# Patient Record
Sex: Female | Born: 1975 | Race: Black or African American | Hispanic: No | Marital: Single | Smoking: Current every day smoker
Health system: Southern US, Community
[De-identification: ages and names within clinical notes are randomized; demographics above are authoritative.]

## PROBLEM LIST (undated history)

## (undated) HISTORY — PX: BACK SURGERY: SHX140

## (undated) HISTORY — PX: TUBAL LIGATION: SHX77

---

## 2003-05-29 ENCOUNTER — Ambulatory Visit (HOSPITAL_COMMUNITY): Admission: RE | Admit: 2003-05-29 | Discharge: 2003-05-30 | Payer: Self-pay | Admitting: Neurosurgery

## 2004-01-27 ENCOUNTER — Emergency Department: Payer: Self-pay | Admitting: Internal Medicine

## 2005-07-24 ENCOUNTER — Emergency Department: Payer: Self-pay | Admitting: Emergency Medicine

## 2005-10-08 ENCOUNTER — Emergency Department: Payer: Self-pay | Admitting: Emergency Medicine

## 2005-10-13 ENCOUNTER — Ambulatory Visit: Payer: Self-pay | Admitting: Emergency Medicine

## 2005-11-20 ENCOUNTER — Emergency Department: Payer: Self-pay | Admitting: Emergency Medicine

## 2005-11-25 ENCOUNTER — Emergency Department: Payer: Self-pay | Admitting: Emergency Medicine

## 2005-12-30 ENCOUNTER — Emergency Department: Payer: Self-pay | Admitting: Unknown Physician Specialty

## 2006-01-23 ENCOUNTER — Encounter: Payer: Self-pay | Admitting: Neurosurgery

## 2006-01-26 ENCOUNTER — Encounter: Payer: Self-pay | Admitting: Neurosurgery

## 2006-02-13 ENCOUNTER — Emergency Department: Payer: Self-pay | Admitting: Emergency Medicine

## 2006-02-22 ENCOUNTER — Emergency Department: Payer: Self-pay | Admitting: Emergency Medicine

## 2006-02-25 ENCOUNTER — Emergency Department: Payer: Self-pay | Admitting: Emergency Medicine

## 2006-12-28 ENCOUNTER — Encounter: Payer: Self-pay | Admitting: Maternal & Fetal Medicine

## 2007-06-01 ENCOUNTER — Observation Stay: Payer: Self-pay | Admitting: Unknown Physician Specialty

## 2007-06-01 ENCOUNTER — Inpatient Hospital Stay: Payer: Self-pay | Admitting: Unknown Physician Specialty

## 2007-06-07 ENCOUNTER — Observation Stay: Payer: Self-pay | Admitting: Obstetrics & Gynecology

## 2007-07-08 ENCOUNTER — Emergency Department: Payer: Self-pay | Admitting: Emergency Medicine

## 2007-07-18 ENCOUNTER — Emergency Department: Payer: Self-pay | Admitting: Emergency Medicine

## 2008-08-25 ENCOUNTER — Emergency Department: Payer: Self-pay | Admitting: Emergency Medicine

## 2008-09-09 ENCOUNTER — Emergency Department: Payer: Self-pay | Admitting: Unknown Physician Specialty

## 2010-01-22 ENCOUNTER — Emergency Department: Payer: Self-pay | Admitting: Emergency Medicine

## 2010-05-11 ENCOUNTER — Ambulatory Visit: Payer: Self-pay | Admitting: Obstetrics and Gynecology

## 2010-05-14 LAB — PATHOLOGY REPORT

## 2010-10-15 ENCOUNTER — Emergency Department: Payer: Self-pay | Admitting: Emergency Medicine

## 2011-02-15 ENCOUNTER — Emergency Department: Payer: Self-pay | Admitting: Unknown Physician Specialty

## 2011-05-23 ENCOUNTER — Encounter: Payer: Self-pay | Admitting: Obstetrics & Gynecology

## 2011-07-15 ENCOUNTER — Observation Stay: Payer: Self-pay

## 2011-07-15 LAB — URINALYSIS, COMPLETE
Bilirubin,UR: NEGATIVE
Blood: NEGATIVE
Glucose,UR: NEGATIVE mg/dL (ref 0–75)
Leukocyte Esterase: NEGATIVE
Ph: 6 (ref 4.5–8.0)
Specific Gravity: 1.028 (ref 1.003–1.030)
Transitional Epi: <1

## 2011-07-16 LAB — URINE CULTURE

## 2011-07-21 ENCOUNTER — Encounter: Payer: Self-pay | Admitting: Maternal and Fetal Medicine

## 2011-07-21 LAB — CBC WITH DIFFERENTIAL/PLATELET
Basophil #: 0 10*3/uL (ref 0.0–0.1)
Basophil %: 0.5 %
Eosinophil #: 0 10*3/uL (ref 0.0–0.7)
Eosinophil %: 0.6 %
HGB: 12 g/dL (ref 12.0–16.0)
MCH: 29.2 pg (ref 26.0–34.0)
MCHC: 33.4 g/dL (ref 32.0–36.0)
Monocyte %: 13.1 %
Neutrophil #: 6.1 10*3/uL (ref 1.4–6.5)
Platelet: 186 10*3/uL (ref 150–440)
RDW: 13.4 % (ref 11.5–14.5)

## 2011-07-21 LAB — COMPREHENSIVE METABOLIC PANEL
Albumin: 3.4 g/dL (ref 3.4–5.0)
Bilirubin,Total: 0.4 mg/dL (ref 0.2–1.0)
Chloride: 102 mmol/L (ref 98–107)
EGFR (African American): >60
EGFR (Non-African Amer.): >60
Osmolality: 267 (ref 275–301)
Potassium: 3.4 mmol/L — ABNORMAL LOW (ref 3.5–5.1)
Sodium: 135 mmol/L — ABNORMAL LOW (ref 136–145)

## 2011-07-25 ENCOUNTER — Encounter: Payer: Self-pay | Admitting: Maternal & Fetal Medicine

## 2011-07-25 LAB — CBC WITH DIFFERENTIAL/PLATELET
Basophil #: 0 10*3/uL (ref 0.0–0.1)
Eosinophil #: 0.1 10*3/uL (ref 0.0–0.7)
Eosinophil %: 1.2 %
HCT: 30.1 % — ABNORMAL LOW (ref 35.0–47.0)
HGB: 10.3 g/dL — ABNORMAL LOW (ref 12.0–16.0)
MCV: 87 fL (ref 80–100)
Neutrophil #: 3.7 10*3/uL (ref 1.4–6.5)
Platelet: 200 10*3/uL (ref 150–440)
RDW: 13.5 % (ref 11.5–14.5)

## 2011-07-25 LAB — BASIC METABOLIC PANEL
Anion Gap: 9 (ref 7–16)
BUN: 4 mg/dL — ABNORMAL LOW (ref 7–18)
Calcium, Total: 8.5 mg/dL (ref 8.5–10.1)
EGFR (African American): >60
Osmolality: 273 (ref 275–301)
Sodium: 139 mmol/L (ref 136–145)

## 2011-07-25 LAB — PROTEIN, URINE, 24 HOUR
Protein, 24 Hour Urine: 75 mg/24HR (ref 30–149)
Protein, Urine: 30 mg/dL (ref 0–12)

## 2011-07-25 LAB — HEPATIC FUNCTION PANEL A (ARMC)
Albumin: 2.7 g/dL — ABNORMAL LOW (ref 3.4–5.0)
SGOT(AST): 33 U/L (ref 15–37)
SGPT (ALT): 72 U/L

## 2011-07-25 LAB — CREATININE CLEARANCE, URINE, 24 HOUR
Collection Hours: 24 hours
Creatinine, Serum: 0.57 mg/dL (ref 0.50–1.20)
Creatinine, Urine: 344.4 mg/dL — ABNORMAL HIGH (ref 30.0–125.0)

## 2011-07-27 ENCOUNTER — Ambulatory Visit: Payer: Self-pay | Admitting: Oncology

## 2011-08-15 ENCOUNTER — Encounter: Payer: Self-pay | Admitting: Maternal and Fetal Medicine

## 2011-08-16 ENCOUNTER — Observation Stay: Payer: Self-pay | Admitting: Obstetrics and Gynecology

## 2011-08-16 LAB — PIH PROFILE
Anion Gap: 10 (ref 7–16)
BUN: 7 mg/dL (ref 7–18)
Chloride: 108 mmol/L — ABNORMAL HIGH (ref 98–107)
Creatinine: 0.8 mg/dL (ref 0.60–1.30)
MCH: 29.4 pg (ref 26.0–34.0)
MCHC: 34.3 g/dL (ref 32.0–36.0)
RDW: 13.2 % (ref 11.5–14.5)
WBC: 10 10*3/uL (ref 3.6–11.0)

## 2011-08-16 LAB — COMPREHENSIVE METABOLIC PANEL
Alkaline Phosphatase: 163 U/L — ABNORMAL HIGH (ref 50–136)
Anion Gap: 13 (ref 7–16)
Bilirubin,Total: 0.5 mg/dL (ref 0.2–1.0)
Calcium, Total: 8.4 mg/dL — ABNORMAL LOW (ref 8.5–10.1)
Chloride: 105 mmol/L (ref 98–107)
EGFR (Non-African Amer.): >60
Osmolality: 280 (ref 275–301)
SGOT(AST): 17 U/L (ref 15–37)
Total Protein: 6.7 g/dL (ref 6.4–8.2)

## 2011-08-16 LAB — URINALYSIS, COMPLETE
Bacteria: NONE SEEN
Blood: NEGATIVE
Leukocyte Esterase: NEGATIVE
Nitrite: NEGATIVE
Specific Gravity: 1.029 (ref 1.003–1.030)
WBC UR: 8 /HPF (ref 0–5)

## 2011-08-16 LAB — PROTEIN / CREATININE RATIO, URINE: Protein/Creat. Ratio: 56 mg/gCREAT (ref 0–200)

## 2011-08-23 ENCOUNTER — Emergency Department: Payer: Self-pay | Admitting: Emergency Medicine

## 2011-08-26 ENCOUNTER — Inpatient Hospital Stay: Payer: Self-pay

## 2011-08-26 LAB — CBC WITH DIFFERENTIAL/PLATELET
Basophil #: 0 10*3/uL (ref 0.0–0.1)
Eosinophil #: 0 10*3/uL (ref 0.0–0.7)
Eosinophil #: 0 10*3/uL (ref 0.0–0.7)
Eosinophil %: 0.2 %
HCT: 12 % — CL (ref 35.0–47.0)
HCT: 16.5 % — ABNORMAL LOW (ref 35.0–47.0)
Lymphocyte #: 2.3 10*3/uL (ref 1.0–3.6)
Lymphocyte #: 2.2 10*3/uL (ref 1.0–3.6)
Lymphocyte %: 23 %
MCH: 27.8 pg (ref 26.0–34.0)
MCH: 28.4 pg (ref 26.0–34.0)
MCV: 87 fL (ref 80–100)
MCV: 86 fL (ref 80–100)
Monocyte #: 1 x10 3/mm — ABNORMAL HIGH (ref 0.2–0.9)
Monocyte %: 9.5 %
Neutrophil #: 6.8 10*3/uL — ABNORMAL HIGH (ref 1.4–6.5)
Neutrophil %: 64.8 %
Platelet: 172 10*3/uL (ref 150–440)
Platelet: 151 10*3/uL (ref 150–440)
RDW: 13.2 % (ref 11.5–14.5)

## 2011-08-26 LAB — IRON AND TIBC
Iron Bind.Cap.(Total): 584 ug/dL — ABNORMAL HIGH (ref 250–450)
Iron Saturation: 7 %

## 2011-08-26 LAB — FERRITIN: Ferritin (ARMC): 5 ng/mL — ABNORMAL LOW (ref 8–388)

## 2011-08-27 ENCOUNTER — Ambulatory Visit: Payer: Self-pay | Admitting: Oncology

## 2011-08-27 ENCOUNTER — Ambulatory Visit (HOSPITAL_COMMUNITY)
Admission: AD | Admit: 2011-08-27 | Discharge: 2011-08-27 | Disposition: A | Discharge status: Home / Self Care | Payer: Medicaid Other | Source: Other Acute Inpatient Hospital | Attending: Student | Admitting: Student

## 2011-08-27 DIAGNOSIS — K625 Hemorrhage of anus and rectum: Secondary | ICD-10-CM | POA: Insufficient documentation

## 2011-08-27 DIAGNOSIS — D649 Anemia, unspecified: Secondary | ICD-10-CM | POA: Insufficient documentation

## 2011-08-27 LAB — URINALYSIS, COMPLETE
Bacteria: NONE SEEN
Bilirubin,UR: NEGATIVE
Glucose,UR: NEGATIVE mg/dL (ref 0–75)
Ketone: NEGATIVE
Specific Gravity: 1.023 (ref 1.003–1.030)

## 2011-08-27 LAB — HEMOGLOBIN: HGB: 7.2 g/dL — ABNORMAL LOW (ref 12.0–16.0)

## 2011-09-16 ENCOUNTER — Inpatient Hospital Stay: Payer: Self-pay | Admitting: Obstetrics and Gynecology

## 2011-09-16 LAB — CBC WITH DIFFERENTIAL/PLATELET
Basophil #: 0 10*3/uL (ref 0.0–0.1)
Basophil %: 0.2 %
Eosinophil #: 0 10*3/uL (ref 0.0–0.7)
HCT: 29.6 % — ABNORMAL LOW (ref 35.0–47.0)
HGB: 9.8 g/dL — ABNORMAL LOW (ref 12.0–16.0)
Lymphocyte #: 1.5 10*3/uL (ref 1.0–3.6)
MCH: 27.2 pg (ref 26.0–34.0)
MCHC: 32.9 g/dL (ref 32.0–36.0)
Monocyte #: 0.8 x10 3/mm (ref 0.2–0.9)
Monocyte %: 7.5 %
Neutrophil #: 8.4 10*3/uL — ABNORMAL HIGH (ref 1.4–6.5)
WBC: 10.8 10*3/uL (ref 3.6–11.0)

## 2011-09-17 LAB — HEMATOCRIT: HCT: 26.3 % — ABNORMAL LOW (ref 35.0–47.0)

## 2011-09-26 ENCOUNTER — Ambulatory Visit: Payer: Self-pay | Admitting: Oncology

## 2012-01-14 ENCOUNTER — Emergency Department: Payer: Self-pay | Admitting: Emergency Medicine

## 2012-01-14 LAB — COMPREHENSIVE METABOLIC PANEL WITH GFR
Albumin: 4.5 g/dL
Alkaline Phosphatase: 88 U/L
Anion Gap: 8
BUN: 13 mg/dL
Bilirubin,Total: 1 mg/dL
Calcium, Total: 9.2 mg/dL
Chloride: 102 mmol/L
Co2: 29 mmol/L
Creatinine: 1.05 mg/dL
EGFR (African American): >60
EGFR (Non-African Amer.): >60
Glucose: 86 mg/dL
Osmolality: 277
Potassium: 3.3 mmol/L — ABNORMAL LOW
SGOT(AST): 27 U/L
SGPT (ALT): 21 U/L
Sodium: 139 mmol/L
Total Protein: 8.8 g/dL — ABNORMAL HIGH

## 2012-01-14 LAB — CBC
HCT: 45.4 % (ref 35.0–47.0)
MCHC: 32.3 g/dL (ref 32.0–36.0)
MCV: 84 fL (ref 80–100)
Platelet: 286 10*3/uL (ref 150–440)
RDW: 16.5 % — ABNORMAL HIGH (ref 11.5–14.5)
WBC: 7 10*3/uL (ref 3.6–11.0)

## 2012-01-14 LAB — LIPASE, BLOOD: Lipase: 125 U/L

## 2012-01-14 LAB — URINALYSIS, COMPLETE
Nitrite: NEGATIVE
Protein: 100
Specific Gravity: 1.028 (ref 1.003–1.030)
WBC UR: 89 /HPF (ref 0–5)

## 2012-02-23 ENCOUNTER — Emergency Department: Payer: Self-pay | Admitting: Emergency Medicine

## 2012-02-23 LAB — URINALYSIS, COMPLETE
Blood: NEGATIVE
Nitrite: NEGATIVE
Ph: 5 (ref 4.5–8.0)
Protein: NEGATIVE
Specific Gravity: 1.027 (ref 1.003–1.030)

## 2012-02-23 LAB — PREGNANCY, URINE: Pregnancy Test, Urine: NEGATIVE m[IU]/mL

## 2012-03-17 ENCOUNTER — Emergency Department: Payer: Self-pay | Admitting: Emergency Medicine

## 2012-03-17 LAB — URINALYSIS, COMPLETE
Bacteria: NONE SEEN
Glucose,UR: NEGATIVE mg/dL (ref 0–75)
Nitrite: NEGATIVE
Protein: 30
RBC,UR: 6 /HPF (ref 0–5)
Specific Gravity: 1.028 (ref 1.003–1.030)
WBC UR: 4 /HPF (ref 0–5)

## 2012-03-17 LAB — CBC
HCT: 37.2 % (ref 35.0–47.0)
MCHC: 32.2 g/dL (ref 32.0–36.0)
MCV: 85 fL (ref 80–100)
RDW: 14.6 % — ABNORMAL HIGH (ref 11.5–14.5)

## 2012-03-17 LAB — COMPREHENSIVE METABOLIC PANEL
Anion Gap: 10 (ref 7–16)
Calcium, Total: 9 mg/dL (ref 8.5–10.1)
Chloride: 104 mmol/L (ref 98–107)
Co2: 25 mmol/L (ref 21–32)
EGFR (African American): >60
EGFR (Non-African Amer.): >60
Osmolality: 277 (ref 275–301)
Potassium: 3.3 mmol/L — ABNORMAL LOW (ref 3.5–5.1)
Sodium: 139 mmol/L (ref 136–145)

## 2013-08-07 ENCOUNTER — Emergency Department: Payer: Self-pay | Admitting: Emergency Medicine

## 2013-08-08 ENCOUNTER — Emergency Department: Payer: Self-pay | Admitting: Emergency Medicine

## 2013-08-08 LAB — URINALYSIS, COMPLETE
BACTERIA: NONE SEEN
BILIRUBIN, UR: NEGATIVE
BLOOD: NEGATIVE
Glucose,UR: NEGATIVE mg/dL (ref 0–75)
NITRITE: NEGATIVE
PH: 5 (ref 4.5–8.0)
Protein: 30
RBC,UR: 22 /HPF (ref 0–5)
SPECIFIC GRAVITY: 1.034 (ref 1.003–1.030)
Squamous Epithelial: 11

## 2013-08-08 LAB — CBC WITH DIFFERENTIAL/PLATELET
BASOS ABS: 0 10*3/uL (ref 0.0–0.1)
Basophil %: 0.4 %
Eosinophil #: 0 10*3/uL (ref 0.0–0.7)
Eosinophil %: 0.3 %
HCT: 43.1 % (ref 35.0–47.0)
HGB: 13.9 g/dL (ref 12.0–16.0)
Lymphocyte #: 2 10*3/uL (ref 1.0–3.6)
Lymphocyte %: 27.6 %
MCH: 27 pg (ref 26.0–34.0)
MCHC: 32.2 g/dL (ref 32.0–36.0)
MCV: 84 fL (ref 80–100)
MONOS PCT: 10.9 %
Monocyte #: 0.8 x10 3/mm (ref 0.2–0.9)
NEUTROS PCT: 60.8 %
Neutrophil #: 4.3 10*3/uL (ref 1.4–6.5)
PLATELETS: 287 10*3/uL (ref 150–440)
RBC: 5.12 10*6/uL (ref 3.80–5.20)
RDW: 15.8 % — AB (ref 11.5–14.5)
WBC: 7.1 10*3/uL (ref 3.6–11.0)

## 2013-08-08 LAB — COMPREHENSIVE METABOLIC PANEL
ALBUMIN: 4.2 g/dL (ref 3.4–5.0)
ALK PHOS: 80 U/L
ALT: 12 U/L (ref 12–78)
Anion Gap: 8 (ref 7–16)
BUN: 13 mg/dL (ref 7–18)
Bilirubin,Total: 0.7 mg/dL (ref 0.2–1.0)
CHLORIDE: 101 mmol/L (ref 98–107)
CO2: 28 mmol/L (ref 21–32)
Calcium, Total: 9.3 mg/dL (ref 8.5–10.1)
Creatinine: 0.82 mg/dL (ref 0.60–1.30)
EGFR (African American): >60
Glucose: 103 mg/dL — ABNORMAL HIGH (ref 65–99)
Osmolality: 274 (ref 275–301)
Potassium: 3.1 mmol/L — ABNORMAL LOW (ref 3.5–5.1)
SGOT(AST): 10 U/L — ABNORMAL LOW (ref 15–37)
Sodium: 137 mmol/L (ref 136–145)
Total Protein: 8.9 g/dL — ABNORMAL HIGH (ref 6.4–8.2)

## 2013-08-08 LAB — LIPASE, BLOOD: LIPASE: 119 U/L (ref 73–393)

## 2014-07-20 NOTE — Consult Note (Signed)
PATIENT NAME:  Emily Parker, RULE MR#:  [id number] DATE OF BIRTH:  [birth year]  DATE OF CONSULTATION:  08/26/2011  REFERRING PHYSICIAN:  Vena Austria, MD CONSULTING PHYSICIAN:  Barnetta Chapel, MD / Ranae Plumber. Arvilla Market, ANP  REASON FOR CONSULTATION: Hemoglobin 3.8 with bright red rectal bleeding, [redacted] weeks pregnant.   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 The patient reports a 40 pound weight loss since she was pregnant, at least since October, including 20 pounds just in this last month of May. The patient has a history of gravida 28, elective abortion 8, para 2, and 2 medical abortions. She has ahd  seven dilation and curettage surgeries, and had a retained placenta with recent pregnancy in February 2012 requiring 4 unit blood transfusion. She has preeclampsia noted in the chart from January 2013 and was started on iron supplements 05/30/2011 for anemia. Record review shows a hemoglobin of 11.3 in January 2013, a hemoglobin of 10.3 on 07/25/2011, and admission hemoglobin of 4.8 with recheck at 3.8 with hematocrit 12, platelet count 172 and normocytic indices with MCV 87. The patient is awaiting her first unit of blood transfusion.   The patient states that she had diagnosis of Norovirus the first of May with severe vomiting, had some dark brown emesis at that time. She was seen in the emergency room.  She has also been passing bright red blood every time she sits down to have a bowel movement. This blood has clotted at times. She says it is coming from her hemorrhoids. She denies any rectal itching, burning or pain. She is unable to describe stool, thinks she passes blood twice a day, probably with stool, but stool looks red, maybe soft. She denies constipation, diarrhea, and denies any change in her  usual bowel pattern. About twice in a month she will wake up at 4:00 a.m. with an urge to have a bowel movement. She says she passes soft red stool. There has been no soiling or incontinence and no abdominal pain, cramps, or bloating. Weight loss as noted. The last time she had a bowel movement was early this morning and said she passed bright red again. Last time she vomited was a week ago, and presented to the emergency room and required three bags of fluid. Her emesis was described as yellow-green, no black, but the patient thinks it looked a little darkish. She did take a BC Powder once, rarely uses any type of pain medication, and ibuprofen is listed as an allergy. She denies history of ulcers or previous EGD or colonoscopy study. Currently, the patient feels a little lightheaded, weak, dizzy, and she has been noticing progressive fatigue over the month of May. She is no longer employed. Gastroenterology has been asked to see the patient regarding assistance with further evaluation and management.   PAST MEDICAL HISTORY:  1. Gravida 12, para 2, elective abortion 8, medical abortion 2.  2. Anemia with blood transfusion February 2012.  3. Urticaria x15 years.  4. Tobacco use, quit earlier in pregnancy.  5. Back surgery.  6. Dilation and curettage x8.   MEDICATIONS ON ARRIVAL:  1. Prenatal vitamins started 05/30/2011. 2. Ferrous sulfate started 05/30/2011. 3.   Tagament  ALLERGIES: Penicillin yields hives, ibuprofen yields hives.  HABITS: Positive tobacco, quit earlier in pregnancy. Denies alcohol. Denies illicit drug  use.   FAMILY HISTORY: Negative for first degree relative with colorectal polyp or neoplasm or ulcers. Maternal great-grandmother had colon polyps. Maternal aunt had colon polyps.  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 The patient is single, has two children at  home.  REVIEW OF SYSTEMS: Positive for fatigue, headache, and presyncope at the first of this week. She took a [facility] Powder three weeks ago for headaches. Very rare NSAID use. Denies chest pain, pressure, heaviness, tightness, cough, shortness of breath, dyspnea on exertion, fevers or chills. Denies abdominal pain, GI history as noted. Denies lower extremity edema, cramps, leg pain, joint pain, swelling, or inflammation. Remaining 10 systems negative.   PHYSICAL EXAMINATION:   VITAL SIGNS: 98.5, 100, respirations 18, 112/57.   GENERAL: Obese, African American female resting in bed in no acute distress.   HEENT: Head is normocephalic. Conjunctivae pale. Tongue pale. Oral mucosa is dry and intact.   NECK: Supple with trachea midline, no lymphadenopathy.   HEART: Heart tones S1 and S2 tachycardia noted.   LUNGS: Clear to auscultation. Respirations are eupneic.   ABDOMEN: Pregnant, soft, and nontender. She has fetal monitoring band in place.   RECTAL: Examination shows small external hemorrhoids. These are non-thrombosed. There is pink mucus on the gloved hand. Possible internal hemorrhoid is present. No discrete internal masses. No formed stool present. This was mucus on the gloved hand. The patient tolerated well. No tenderness and no obvious fissure or tear.   EXTREMITIES: Lower extremities without edema, cyanosis, or clubbing.   SKIN: Tattoos.   MUSCULOSKELETAL: Joints without redness or erythema. Gait not evaluated as advised the patient remain on bedrest given presyncope state and severe anemia for risk of orthostatic hypotension.   NEUROLOGIC: Cranial nerves II through XII are grossly intact.   PSYCH: The patient's affect and mood within normal. She is alert, oriented, and interested in treatment plan, cooperative, and pleasant.   LABS/STUDIES: Admission blood work with hemoglobin 3.8, hematocrit 12.0, platelet count 172, MCV 87, MCH 27.8, RDW 13.2, antibody screen negative,  O-positive.    IMPRESSION: The patient is with severe anemia, looks normocytic, but would expect to find iron deficiency given her pregnancy history. A stat ferritin was ordered and actually returned low at 5. Would expect microcytic indices as well. Possibility of acute GI bleed is definitely present given her severe symptoms of two episodes of vomiting, reports of possible coffee-ground emesis to suggest peptic ulcer disease, gastric erosion, or gastric ulcer. In addition to the upper focus, the patient has been passing bright red blood for a month that she attributes to hemorrhoids. She clearly has some mild hemorrhoids present, but would not expect a hemorrhoid bleed to give her this drastic a hemoglobin from 07/25/2011 at 10.33 to 3.8 currently. The patient has no hemorrhoidal-type symptoms, and she is really unable to describe her stool consistency or shape.  Apparently she is passing this rectal blood twice a day. This is painless without any abdominal pain or cramps. She reports weight loss of 20 pounds this month and 40 pounds since October 2012 total, I do not have baseline weight to correlate. The patient has clearly symptomatic hemodynamically significant anemia with fatigue, tired, presyncope and grossly low hemoglobin of 3.8, she has mild tachycardia and is maintaining blood pressure and mentation.   PLAN:  1. Blood transfusion as soon as possible. Do need to be careful, if we double  her blood volume in 24 hours, to check lungs for pulmonary edema. The patient is young, so she may be able to tolerate it, but will need to use caution.  2. The fetus has been monitored and is reported to show normal  vital signs and her obstetrician is following.  3. Treat for empiric gastric ulcer/duodenal ulcer with proton pump inhibitor, Protonix, twice a day. Dr. Marva PandaSkulskie is in favor of oral therapy at this time. He has not used Carafate in pregnancy and it may make constipation develop and further progress  bright red rectal bleeding, so we will avoid Carafate at this time.  4. Flexible sigmoidoscopy, tentative for tomorrow, and this will be un-sedated. I explained to the patient the flexible sigmoidoscopy plan and Dr. Marva PandaSkulskie will be in to talk with her this evening as well. Dr. Marva PandaSkulskie would like the hemoglobin to be 8 or above if possible so we can safely give her this much amount of blood in the 24 hour period without causing pulmonary edema problems. Clear liquid diet only for now, no carbonation and no red beverages. Close monitoring regarding vital signs, and would check a CBC after second unit of blood is infused and make further recommendations. She has been typed and crossed for 4 units of packed red blood cells. She has had no further bowel movements since this morning and there is no active aggressive rectal bleeding in process now. The patient has denied vomiting since last week and seems to be tolerating clear liquids well today. Gastroenterology to continue to follow.  5. Would recommend hematology consultation as well for severe anemia, normocytic indices, and low ferritin in this pregnant patient to rule out other hematological  abnormalities. 6. We have added B12 and folate in addition to iron and ferritin labs prior to the blood transfusion for baseline readings.            The case was discussed with Dr. Marva PandaSkulskie in collaboration of care.   These services are provided by Cala BradfordKimberly A. Arvilla MarketMills, RN, MS, APRN, [facility], ANP under collaborative agreement with Scot Junobert T. Elliott, MD. ____________________________ Ranae PlumberKimberly A. Arvilla MarketMills, ANP kam:slb D: 08/26/2011 14:57:10 ET T: 08/26/2011 15:32:12 ET JOB#: [id number]  cc: Cala BradfordKimberly A. Arvilla MarketMills, ANP, <Dictator>  Ranae PlumberKimberly A. Suzette BattiestMills RN, MSN, ANP-BC Adult Nurse Practitioner ELECTRONICALLY SIGNED 08/26/2011 16:13

## 2014-07-20 NOTE — Consult Note (Signed)
Chief Complaint:   Subjective/Chief Complaint Patient seen and examined, chart reviewed.  Patient admitted with reported rectal bleeding and profound anemia.  Currently being transfused, hemodynamically stable.   No overt bleeding currently though heme positive on rectal exam.  Reported norovirus enteritis earlier in the month with dark emesis.  Patietn is 32-33 weks gravida with extensive OB history as noted.    As EGD will require sedation and she has shown no recent emesis or melena, would treat emperically for gastritis/PUD with bid ppi.  With reported  rectal bleding will do a non-sedated flexible sigmoidoscopy tomorrow.  I have discussed the risks benefits and complications of flex sig with Ms Tornow to include not limited to bleeding infection perforation and she wishes to proceed.  Have discussed with Dr Bonney AidStaebler, he is in agreement with flex sig, will prep with fleets enema in am.  Agree with plans for transfusion and serial hgb.   Electronic Signatures: Barnetta ChapelSkulskie, Martin (MD)  (Signed [phone]-May-13 20:28)  Authored: Chief Complaint   Last Updated: 31-May-13 20:28 by Barnetta ChapelSkulskie, Martin (MD)

## 2014-07-20 NOTE — Consult Note (Signed)
Referral Information:   Reason for Referral Pregnant with history of hemorrhage following elective abortion and prior pregnancy with preeclampsia.    Referring Physician [facility] Department    Prenatal Hx 39 year-old G12 P 2-0-9-2 presents for consultation.  She reports a history of term preelcampsia following the birth of her second child that required post partum hospitalization of blood pressure control. She was maintained on anti-hypertensive therapy for six weeks, at which time she discontinued the medication.  She denies a history of chronic hypertension.  She also has a history of multiple first trimester elective abortions, stating she thinks she has had 8 procedures.  She reports that the first seven were all surgical D&C's and did not have any complications.  Her most recent was in 2012.  At 9 weeks, she had a medical abortion ([facility] [place]) that resulted in an incomplete abortion with heavy vaginal bleeding. She presented to [facility] where she had a D&C for retained products.  She did well but required a blood transfusion.    Past Obstetrical Hx G12 P 2092 -2001, spontaneous vaginal delivery at term, female 7 pounds 7 ounces, [facility], no complications -2002, first trimester SAB, no complications -2009, spontaneous vaginal delivery at term, female 7 pounds 6 ounces, [facility], preeclampsia -8 elective first tirmester abortions (7 surgical D&Cs, 1 medical abortion complicated by retained products that required D&C and transfusion)   Home Medications: Medication Instructions Status  Prenatal Multivitamins oral tablet 1 tab(s) orally once a day Active   Allergies:   Ibuprofen: Hives  PCN: Swelling, Hives  Vital Signs/Notes:  Nursing Vital Signs: **Vital Signs.:   25-Feb-13 09:29   Systolic BP Systolic BP 144   Diastolic BP (mmHg) Diastolic BP (mmHg) 68    09:32   Systolic BP Systolic BP 150   Diastolic BP (mmHg) Diastolic BP (mmHg) 63   Perinatal Consult:    PGyn Hx Denies history of abnormal paps.  Remote history of chllamydia that was treated    Past Medical History cont'd Denies history of hypertension, diabetes, thryoid disorder.    PSurg Hx Back surgery (discectomy with fusion), multiple D&Cs    FHx Denies FH of genetic disorders, mental retardation.  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 Denies ETOH or drugs   Review Of Systems:   Subjective No complaints.    Fever/Chills No    Cough No    Abdominal Pain No    Diarrhea No    Constipation No    Nausea/Vomiting No    SOB/DOE No    Chest Pain No    Dysuria No    Tolerating Diet Yes    Medications/Allergies Reviewed Medications/Allergies reviewed   Impression/Recommendations:   Impression 39 year-old G12 P 2082 at 20 1/7 weeks with history of hemorrhage following an elective first trimester abortion secondary to retained products of conception and history of preeclampsia.  Her blood pressure here is mildly elevated (150/63) today.  Also with family history of cleft lip.    Recommendations 1. Family history of cleft lip.  She was seen by [facility] today (see separately dictated consultation).  Detailed ultrasound today demonstrated a normal appearing lip.  I explained that ultrasound cannot rule out the presence of isolated cleft palate.  2. History of hemorrhage following elective medical abortion.  We obtained her records from [facility]. She presented with vaginal bleeding, underwent  a D&C and was followed to have retained products of conception. She was transfused.  Retained products of conception due to incomplete abortion is a common complication of medical abortion. I would not expect this to increase her risk of complications this pregnancy.  Her antibody screen was negative.   3. History of term preeclampsia, now with borderline BP.  She is at risk for recureent  preeclampsia. Please obtain baseline 24 hour urine protein/creatinine clearance, LFTs and uric acid levels.  We reviewed signs and symptoms of preeclampsia.  Please follow her blood pressure.  Today was mildly elevated but review of her prenatal record demonstrates normal values (116/70, 130/74).  If greater than 150/90, please refer her back. Recommend growth scan at 28 weeks. We will be happy to perform if desired.     Total Time Spent with Patient 45 minutes    >50% of visit spent in couseling/coordination of care yes    Office Use Only 99[address]  Level 3 (40min) NEW office consult detailed   Coding Description: MATERNAL CONDITIONS/HISTORY INDICATION(S).   OTHER: Prior pregnancy with preelcampsia. Family history of cleft lip. Retained products after abortion.  Electronic Signatures for Addendum Section:  Rondall AllegraLivingston, Elizabeth Gresham (MD) (Signed Addendum [phone]-Mar-13 15:59)  Addendum - Patient has a history of chronic urticaria that was not addressed during the consult. I f mild and limited toskin manifestations, it is reasonable to use a non sedating antihistmaine (like allegra , claritin or zyrtec) on a daily or prn basis. If the patient has a history of angioedema accompanying her hives then referral to an allergist is indicated for further diagnosis. If she has ever had airway compromise then anesthesia consult prior to delivery shoudl be done. If the patient has symptoms of thyroid d/o or lupus then further laboratory evaluation is indicated.   Electronic Signatures: Danh Bayus, [place] (MD)  (Signed [phone]-Feb-13 11:23)  Authored: Referral, Home Medications, Allergies, Vital Signs/Notes, Consult, Exam, Impression, Billing, Coding Description   Last Updated: 25-Mar-13 15:59 by Rondall AllegraLivingston, Elizabeth Gresham (MD)

## 2014-07-20 NOTE — Consult Note (Signed)
History of Present Illness:   Reason for Consult Severe symptomatic anemia, pregnancy.    HPI   Patient is a 39 year old female who is approximately [redacted] weeks pregnant since June weak and dizzy and was found to have a hemoglobin of 4.8.  Repeat lab testing confirmed resolved and was actually lower at 3.8.  She reports daily rectal bleeding from "hemorrhoids" for the last month.  There is also question whether she had any hematemesis.  She otherwise feels well.  She has no neurologic complaints.  She is gaining weight appropriately.  She denies any chest pain or shortness of breath.  She has no urinary complaints.  Patient otherwise feels well and offers no further specific complaints.  PFSH:   Additional Past Medical and Surgical History Past medical history: Back surgery, D&C x8, anemia.  Social history: Positive tobacco, denies alcohol.  Family history: Negative and noncontributory.   Review of Systems:   Performance Status (ECOG) 0    Review of Systems   As per HPI. Otherwise, 10 point system review was negative.   NURSING NOTES: **Vital Signs.:   31-May-13 16:10    Vital Signs Type: Blood Transfusion Complete    Temperature Temperature (F): 97.9    Celsius: 36.6    Temperature Source: oral    Pulse Pulse: 101    Pulse source: per Dinamap    Respirations Respirations: 18    Systolic BP Systolic BP: 97    Diastolic BP (mmHg) Diastolic BP (mmHg): 56    Mean BP: 69    BP Source: vital sign device    Pulse Ox % Pulse Ox %: 100    Oxygen Delivery: Room Air/ 21 %   Physical Exam:   Physical Exam General: Well-developed, well-nourished, no acute distress. Eyes: Pale conjunctiva, anicteric sclera. HEENT: Normocephalic, moist mucous membranes, clear oropharnyx. Lungs: Clear to auscultation bilaterally. Heart: Regular rate and rhythm. No rubs, murmurs, or gallops. Abdomen: Normoactive bowel sounds. Musculoskeletal: No edema, cyanosis, or clubbing. Neuro: Alert,  answering all questions appropriately. Cranial nerves grossly intact. Skin: No rashes or petechiae noted. Psych: Normal affect.    Ibuprofen: Hives  PCN: Swelling, Hives    Prenatal Multivitamins oral tablet: 1 tab(s) orally once a day, Active, 0, None   Tagamet HB 200 mg oral tablet: 1 tab(s) orally 2 times a day, As Needed- for Indigestion, Heartburn , Active, 0, None   Fe 50:  orally , Active, 0, None    Routine Hem:  31-May-13 10:41    WBC (CBC) 10.1   RBC (CBC) 1.38   Hemoglobin (CBC) 3.8   Hematocrit (CBC) 12.0   Platelet Count (CBC) 172   MCV 87   MCH 27.8   MCHC 32.1   RDW 13.2   Neutrophil % 67.1   Lymphocyte % 23.0   Monocyte % 9.5   Eosinophil % 0.2   Basophil % 0.2   Neutrophil # 6.8   Lymphocyte # 2.3   Monocyte # 1.0   Eosinophil # 0.0   Basophil # 0.0  Routine Chem:  31-May-13 13:30    Iron, Serum 42   Iron Binding Capacity (TIBC) 584   Unbound Iron Binding Capacity 542   Iron Saturation 7   Ferritin ([facility]) 5   Assessment and Plan:  Impression:   Severe iron deficiency anemia  Plan:   1.  Anemia: Agree with 4 units of packed red blood cells in order to increase patient's hemoglobin to near 8.0.  GI plans to do a  flex sig tomorrow if possible.  B12 and folate are pending.  Will also send hemolysis labs as well as a full anemia workup.  Will also order von Willebrand's panel to assess if there is a an underlying bleeding diathesis.  Continue to follow daily CBC. consult, will follow.  Electronic Signatures: Gerarda FractionFinnegan, Timothy (MD)  (Signed [id number]-May-13 17:16)  Authored: HISTORY OF PRESENT ILLNESS, PFSH, ROS, NURSING NOTES, PE, ALLERGIES, HOME MEDICATIONS, LABS, ASSESSMENT AND PLAN   Last Updated: 31-May-13 17:16 by Gerarda FractionFinnegan, Timothy (MD)

## 2014-07-20 NOTE — Op Note (Signed)
PATIENT NAME:  Emily Parker, Emily Parker MR#:  [id number] DATE OF BIRTH:  1975/08/09  DATE OF PROCEDURE:  09/17/2011  PREOPERATIVE DIAGNOSIS: Postpartum, desires sterilization.   POSTOPERATIVE DIAGNOSIS: Postpartum, desires sterilization.   PROCEDURE: Bilateral partial salpingectomy.   SURGEON: Deloris Pinghilip J. Luella Cookosenow, MD  OPERATIVE FINDINGS: Normal appearing tubes.   DESCRIPTION OF PROCEDURE: After adequate general anesthesia, patient prepped and draped in routine fashion. An infraumbilical incision made through the skin, carried down the various layers, the peritoneal cavity was entered. Right tube was grasped and after adequate identification of the right fimbria, the right tube was doubly ligated with Parker plain suture and Parker portion removed. Parker like procedure was carried out on the other side. Both areas of surgery inspected, found to be hemostatic. Peritoneum was closed with pursestring suture. Fascia reapproximated with interrupted sutures of chromic 0. Skin was closed with subcutaneous stitches. Patient tolerated procedure well. Left the Operating Room in good condition. Sponge and needle counts were said to be correct at end of the procedure. Estimated blood minimal.    ____________________________ Deloris PingPhilip J. Luella Cookosenow, MD pjr:cms D: 09/17/2011 08:32:59 ET T: 09/17/2011 10:48:40 ET JOB#: [id number]  cc: Deloris PingPhilip J. Luella Cookosenow, MD, <Dictator> Towana BadgerPHILIP J Brihanna Devenport MD ELECTRONICALLY SIGNED 09/25/2011 18:40

## 2014-07-20 NOTE — Consult Note (Signed)
Referral Information:   Reason for Referral 39 year-old G12 P 2082 at 29 weeks with history of hemorrhage following an elective first trimester abortion secondary to retained products of conception and history of preeclampsia.  Her blood pressure has been elevated during pregnancy, consistent with chronic versus gestational htn).  She had viral URI sxs at the time of her last consulation (last week) and elevated LFTs.  She reports norovirus exposure and states symptoms (n/v, chills, myalgia) have resolved.  Her son had URI sxs prior to her illness. .    Referring Physician [facility]    Prenatal Hx 39 year-old G12 P 2-0-9-2  with  history of term preelcampsia following the birth of her second child that required post partum hospitalization of blood pressure control. She was maintained on anti-hypertensive therapy for six weeks, at which time she discontinued the medication.  She denies a history of chronic hypertension.  She also has a history of multiple first trimester elective abortions, stating she thinks she has had 8 procedures.  She reports that the first seven were all surgical D&C's and did not have any complications.  Her most recent was in 2012.  At 9 weeks, she had a medical [facility] [place] [place]) that resulted in an incomplete abortion with heavy vaginal bleeding. [facility] where she had a D&C for retained products.  She did well but required a blood transfusion.    Past Obstetrical Hx G12 P 2092 -2001, spontaneous vaginal delivery at term, female 7 pounds 7 ounces, [facility], no complications -2002, first trimester SAB, no complications -2009, spontaneous vaginal delivery at term, female 7 pounds 6 ounces, [facility], preeclampsia -8 elective first tirmester abortions (7 surgical D&Cs, 1 medical abortion complicated by retained products that required D&C and transfusion)   Home Medications: Medication Instructions Status  Prenatal Multivitamins  oral tablet 1 tab(s) orally once a day Active  Tagamet HB 200 mg oral tablet 1 tab(s) orally 2 times a day, As Needed- for Indigestion, Heartburn  Active  Zyrtec 10 mg oral tablet 1 tab(s) orally once a day, As Needed- for Itching  Active   Allergies:   Ibuprofen: Hives  PCN: Swelling, Hives  Vital Signs/Notes:  Nursing Vital Signs: **Vital Signs.:   29-Apr-13 13:51   Vital Signs Type Recheck   Pulse Pulse 102   Pulse source per Dinamap   Respirations Respirations 14   Systolic BP Systolic BP 115   Diastolic BP (mmHg) Diastolic BP (mmHg) 62   Mean BP 79   BP Source Dinamap; lf arm large cuff pt sitting   Perinatal Consult:   PGyn Hx Denies history of abnormal paps.  Remote history of chllamydia that was treated    Past Medical History cont'd Denies history of hypertension, diabetes, thryoid disorder.    PSurg Hx Back surgery (discectomy with fusion), multiple D&Cs    FHx Denies FH of genetic disorders, mental retardation.  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 Denies ETOH or drugs   Review Of Systems:   Subjective No complaints.    Fever/Chills No    Cough No    Abdominal Pain No    Diarrhea No    Constipation No    Nausea/Vomiting No    SOB/DOE No    Chest Pain No    Dysuria No    Tolerating Diet Yes  Medications/Allergies Reviewed Medications/Allergies reviewed   Routine Hem:  29-Apr-13 14:06    Hematocrit (CBC) 30.1  Hepatic:  29-Apr-13 14:06    Bilirubin, Direct < 0.1   Impression/Recommendations:   Impression Follow up consultation to review Blood pressure and liver function test elevations in patient with chronic hypertension v. gestational hypertension and post partum preeclampsia in previous pregnancy --Lfts are now decreasing and appear to correlate with resolution of viral symptoms.  I would recommend follow up evaluation at the  time of her next consultation.  I would also recommend follow up fetal growth in 3 weeks.   We addressed symptoms of severe preeclampsia/warning symptoms 2. ?Angioedema/hives--she will see the allergist today    Recommendations History of term preeclampsia, now with borderline BP.  She is at risk for recureent preeclampsia. Please obtain baseline --she returned her  24 hour urine protein/creatinine clearance, today --repeat  LFTs and metabolic panel in 3 weeks --  We reviewed signs and symptoms of preeclampsia(as noted above) --I have scheduled her follow up growth scan and we can review her blood pressure and labs at the time of that visit.   Plan:   Ultrasound at what gestational ages Monthly > 28 weeks    Antepartum Testing Weekly, Starting at 80 to 36 weeks     Total Time Spent with Patient 30 minutes    >50% of visit spent in couseling/coordination of care yes    Office Use Only 99214  Office Visit Level 4 ( ) EST detailed office/outpt   Coding Description: MATERNAL CONDITIONS/HISTORY INDICATION(S).   HTN - Chronic.  Electronic Signatures: Consuelo Pandy (MD)  (Signed 29-Apr-13 14:47)  Authored: Referral, Home Medications, Allergies, Vital Signs/Notes, Consult, Exam, Lab, Impression, Plan, Billing, Coding Description   Last Updated: 29-Apr-13 14:47 by Consuelo Pandy (MD)

## 2014-07-20 NOTE — Consult Note (Signed)
Referral Information:   Reason for Referral Patient with urticaria without diagnosed etiology Patient previously seen for history of hemorrhage following elective abortion and prior pregnancy with preeclampsia.(This consult was done on 05/23/2011)    Referring Physician [facility]    Prenatal Hx Patient's doctors request further consultation secondary to her history of urtitcaria. The patient reports that for the last 15 years, she gets frequent episodes of urticaria. She does not know what triggers the episodes. She does report that various antihistamines do decrease her symptoms. She does note some worsening when she takes Ranitidine. The patient reports that in the late 1990s, she saw a specialist (she does not know what type) for her condition. She states that no diagnosis was made, and that she was not given any treatment. She reports no worsening of her condition during this pregnancy. The patient reports a history of "tongue thickening" that last occurred over 10 years ago. No episodes during pregnancy.    Past Obstetrical Hx G12 P 2092 -2001, spontaneous vaginal delivery at term, female 7 pounds 7 ounces, [facility], no complications -2002, first trimester SAB, no complications -2009, spontaneous vaginal delivery at term, female 7 pounds 6 ounces, [facility], preeclampsia -8 elective first tirmester abortions (7 surgical D&Cs, 1 medical abortion complicated by retained products that required D&C and transfusion)   Home Medications: Medication Instructions Status  Prenatal Multivitamins oral tablet 1 tab(s) orally once a day Active   Allergies:   Ibuprofen: Hives  PCN: Swelling, Hives  Vital Signs/Notes:  Nursing Vital Signs: **Vital Signs.:   25-Apr-13 12:23   Vital Signs Type Routine   Temperature Temperature (F) 96.5   Celsius 35.8   Temperature Source oral   Pulse Pulse 102   Pulse source per Dinamap   Respirations Respirations 14   Systolic BP Systolic BP 144    Diastolic BP (mmHg) Diastolic BP (mmHg) 64   Mean BP 90   BP Source Dinamap; rt arm pt sitting large cuff    12:25   Vital Signs Type Recheck   Pulse Pulse 103   Pulse source per Dinamap   Respirations Respirations 14   Systolic BP Systolic BP 153   Diastolic BP (mmHg) Diastolic BP (mmHg) 75   Mean BP 101   BP Source Dinamap; lf arm large cuff pt sitting   Perinatal Consult:   PGyn Hx Denies history of abnormal paps.  Remote history of chllamydia that was treated    Past Medical History cont'd Denies history of hypertension, diabetes, thryoid disorder.    PSurg Hx Back surgery (discectomy with fusion), multiple D&Cs    FHx Denies FH of genetic disorders, mental retardation.  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 Denies ETOH or drugs   Impression/Recommendations:   Impression 861) 39 year-old G12 P 2082 at 28 4/7 weeks 2) Episodic urticaria without diagnosis of etiology 3) Increased BP today    Recommendations 1) Referral to ENT/Allergy & Immunology 2) Will collect baseline labs and 24 hour urine 3) RTC in 4 days for review of labs 4) Patient underwent ultrasound today     Total Time Spent with Patient 15 minutes    >50% of visit spent in couseling/coordination of care yes    Office Use Only 99212  Office Visit Level 2 (10min) EST prob focused office/outpt   Coding Description: MATERNAL CONDITIONS/HISTORY INDICATION(S).   OTHER: Urticaria.  Electronic Signatures: Marcelino Scot (MD)  (Signed 25-Apr-13 14:30)  Authored: Referral, Home Medications, Allergies, Vital Signs/Notes, Consult, Impression, Billing, Coding Description   Last Updated: 25-Apr-13 14:30 by Marcelino Scot (MD)

## 2014-08-05 NOTE — H&P (Signed)
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 she uis stable and is here in labor at 5 cm at 36 weeks 4 days. would like to have BTL has signed papers,    Gravida 12    Term 2    PreTerm 0    Abortion 9    Living 2    Blood Type O positive    Group B Strep Results (Result >5wks must be treated as unknown) negative    Maternal HIV Negative    Maternal Syphilis Ab Nonreactive    Maternal Varicella Immune    Rubella Results immune    Maternal T-Dap Unknown    [facility]    Presents with contractions    Patient's Medical History No Chronic Illness    Patient's Surgical History none    Medications Pre Natal Vitamins  Iron  anusolm hc supp    Allergies PCN, IBUPROFEN all causse hives    Social History tobacco    Family History Non-Contributory    Current Prenatal Course Notable For hmgh due to internal hmds   ROS:   ROS All systems were reviewed.  HEENT, CNS, GI, GU, Respiratory, CV, Renal and Musculoskeletal systems were found to be normal.   Exam:   Vital Signs stable    Urine Protein not completed    General no apparent distress    Mental Status clear    Chest clear    Heart normal sinus rhythm    Abdomen gravid, tender with contractions    Estimated Fetal Weight Small for gestational age    Fetal Position v    Fundal Height term    Back no CVAT    Edema no edema    Pelvic no external lesions, 5-6 cm    Mebranes Intact    FHT normal rate with no decels    FHT Description strictly reactive    Fetal Heart Rate 140    Ucx irregular    Ucx Pain Scale 7    Skin dry    Lymph no lymphadenopathy   Impression:   Impression active labor   Plan:   Comments iv fluids, ppbtl after delivery    Follow Up Appointment need to schedule. in 6 weeks   Electronic Signatures: Adria DevonKlett, Seon Gaertner (MD)   (Signed 21-Jun-13 17:14)  Authored: L&D Evaluation   Last Updated: 21-Jun-13 17:14 by Adria DevonKlett, Charisa Twitty (MD)

## 2014-08-05 NOTE — H&P (Signed)
L&D Evaluation:  History:   HPI 39 yo G12P2092 @ 6045w5d by 20 wk US derived EDC of 10/09/11 seen at the health department yesterday and repoting dizzyness and general malaise.  CBC was checked and returned with Hgb of 4.8.  Patient presents for further evaluation here on L&D.  The patient has not had any vaginal bleeding, abdominal pain.  Reports +FM, no LOF, no ctx.  However, she does report hematochezia for several weeks with bowl movements.  Unable to describe color of stool secondary to amount of blood in toilet.  Bleeding is limited to BM's, having twice daily, mild consitpation.  No hematemesis, n/v.    Presents with nausea/vomiting    Patient's Medical History Anemia, NSVD x 2    Patient's Surgical History Back surgery, D&C x 8    Medications Pre Natal Vitamins    Allergies PCN, Ibuprofen (both hives)    Social History tobacco    Family History Non-Contributory   Exam:   General no apparent distress, CONJUCTIVA PALE, SCLERA ANICTERIC    Mental Status clear    Heart TACHY    Estimated Fetal Weight Average for gestational age, VTX    Edema no edema    Other Rectal: no active bleeding, small external hemerroid, minimal bright red blood on glove, possible internal hemerroid palpated   Impression:   Impression Severe anemia secondary to lower GI bleed   Plan:   Comments - T&S, CBC - Transfuse 2U pRBC with 2U on standby pending post transfusion CBC - GI consult to further evaluate nature of bleeding and any possible interventions to stop bleeding   Electronic Signatures: Lorrene ReidStaebler, Vishnu Moeller M (MD)  (Signed 31-May-13 11:31)  Authored: L&D Evaluation   Last Updated: 31-May-13 11:31 by Lorrene ReidStaebler, Arbell Wycoff M (MD)

## 2014-08-05 NOTE — H&P (Signed)
L&D Evaluation:  History Expanded:   HPI 39 yo [id number] whose EDC = 7/14.  Pt followed at [facility] for this pregnancy.  Pt presents with 24 hours of N+V.    Blood Type O positive    Maternal HIV Negative    Maternal Syphilis Ab Nonreactive    Rubella Results immune    Patient's Medical History No Chronic Illness    Patient's Surgical History back surgery    Medications Pre Natal Vitamins    Allergies NKDA    Social History previous smoker   ROS:   ROS All systems were reviewed.  HEENT, CNS, GI, GU, Respiratory, CV, Renal and Musculoskeletal systems were found to be normal.   Exam:   Vital Signs stable    General mod vomiting    Mental Status clear    Chest clear    Heart normal sinus rhythm    Abdomen gravid, non-tender    Estimated Fetal Weight Average for gestational age    Back no CVAT    Edema no edema    FHT normal rate with no decels   Impression:   Impression Gastroenteritis   Plan:   Comments will hydrate, give zophran   Electronic Signatures: Towana Badgerosenow, Atha Mcbain J (MD)  (Signed 19-Apr-13 10:16)  Authored: L&D Evaluation   Last Updated: 19-Apr-13 10:16 by Towana Badgerosenow, Nalee Lightle J (MD)

## 2014-08-05 NOTE — H&P (Signed)
L&D Evaluation:  History:   HPI 39 yo G12P2092 @ 32.2wks EDC 10/09/11 by 20wk scan presents to [facility] today with nausea/vomiting x 1 day.  Pt has h/o EAB x 8, with hemorrhage requiring surgery and transfusion after the medical EAB in 2012.  She has h/o preeclampsia dx after her last pregnancy when home health nurse took her BP and found it elevated.  She denies elevated BP before delivery of that pregnancy.  Today, she reports n/v for 2 days that just started without cause.  Had eaten cereal that morning with vomiting starting that afternoon.  Unable to tolerate po since.  Had a similar viral illness mid-April.  Reports not eating out or sick contacts.  Review of her records showed that [facility] of recurrent urticaria.  She has also had elevated BP at multiple times during this pregnancy, first documented at about 28wks.  PIH panel and 24hr urine was done in Feb/March -- nml PIH panel and 62mg  prot in 24hr.  She had 1hr glucola of 135, which was called elevated by HD with subsequent normal 3hr GTT.  She also had elevated LFTs almost 3wks ago -- She currently denies HA, visual changes or abdominal pain.  +FM, no ctxs or vaginal bleeding.  She has received Phenergan and Zofran IV since admission but has still vomited at least twice.  Phenergan was d/c'd and Reglan was added.  She is feeling better and tolerating ginger ale now.    Presents with nausea/vomiting    Patient's Medical History Anemia, NSVD x 2    Patient's Surgical History Back surgery, D&C x 8    Allergies PCN, Ibuprofen (both hives)    Social History tobacco    Family History Non-Contributory   ROS:   ROS see HPI, all others neg   Exam:   Vital Signs BP elevated (140s-170s/50s-80s)    General no apparent distress, Falling asleep during assessment    Mental Status clear    Chest clear    Heart normal sinus rhythm    Abdomen gravid, tender with contractions    Edema no edema     Reflexes 1+    Pelvic deferred    Mebranes Intact    FHT normal rate with no decels    Fetal Heart Rate 150    Ucx absent    Skin dry   Impression:   Impression 32.2wk IUP with nausa and vomiting and probable viral gastroenteritis, gestational HTN r/o preeclampsia   Plan:   Comments 1)  Has not vomited in 2 hours.  Will try to advance to something solid.  If can tolerate, will consider d/c home on antiemetics. 2)  PIH panel and P/C ratio pending.  Cont to monitor BP.  Again, if all WNL, d/c home with close f/u at HD.   Electronic Signatures: Senaida LangeWeaver-Lee, Cimberly Stoffel (MD)  (Signed 21-May-13 22:03)  Authored: L&D Evaluation   Last Updated: 21-May-13 22:03 by Senaida LangeWeaver-Lee, Clay Menser (MD)

## 2015-09-24 ENCOUNTER — Emergency Department
Admission: EM | Admit: 2015-09-24 | Discharge: 2015-09-24 | Disposition: A | Discharge status: Home / Self Care | Payer: Self-pay | Attending: Emergency Medicine | Admitting: Emergency Medicine

## 2015-09-24 ENCOUNTER — Encounter: Payer: Self-pay | Admitting: Emergency Medicine

## 2015-09-24 ENCOUNTER — Emergency Department: Payer: Self-pay

## 2015-09-24 DIAGNOSIS — F17200 Nicotine dependence, unspecified, uncomplicated: Secondary | ICD-10-CM | POA: Insufficient documentation

## 2015-09-24 DIAGNOSIS — R112 Nausea with vomiting, unspecified: Secondary | ICD-10-CM

## 2015-09-24 DIAGNOSIS — Z79899 Other long term (current) drug therapy: Secondary | ICD-10-CM | POA: Insufficient documentation

## 2015-09-24 DIAGNOSIS — K5732 Diverticulitis of large intestine without perforation or abscess without bleeding: Secondary | ICD-10-CM | POA: Insufficient documentation

## 2015-09-24 DIAGNOSIS — R197 Diarrhea, unspecified: Secondary | ICD-10-CM

## 2015-09-24 LAB — CBC WITH DIFFERENTIAL/PLATELET
BASOS PCT: 0 %
Basophils Absolute: 0 10*3/uL (ref 0–0.1)
Eosinophils Absolute: 0 10*3/uL (ref 0–0.7)
Eosinophils Relative: 0 %
HEMATOCRIT: 36.8 % (ref 35.0–47.0)
HEMOGLOBIN: 12.1 g/dL (ref 12.0–16.0)
LYMPHS ABS: 1.1 10*3/uL (ref 1.0–3.6)
LYMPHS PCT: 7 %
MCH: 25.8 pg — ABNORMAL LOW (ref 26.0–34.0)
MCHC: 32.8 g/dL (ref 32.0–36.0)
MCV: 78.5 fL — AB (ref 80.0–100.0)
MONOS PCT: 9 %
Monocytes Absolute: 1.5 10*3/uL — ABNORMAL HIGH (ref 0.2–0.9)
NEUTROS ABS: 13.8 10*3/uL — AB (ref 1.4–6.5)
NEUTROS PCT: 84 %
Platelets: 280 10*3/uL (ref 150–440)
RBC: 4.69 MIL/uL (ref 3.80–5.20)
RDW: 16.3 % — ABNORMAL HIGH (ref 11.5–14.5)
WBC: 16.4 10*3/uL — ABNORMAL HIGH (ref 3.6–11.0)

## 2015-09-24 LAB — URINALYSIS COMPLETE WITH MICROSCOPIC (ARMC ONLY)
BILIRUBIN URINE: NEGATIVE
GLUCOSE, UA: NEGATIVE mg/dL
LEUKOCYTES UA: NEGATIVE
NITRITE: NEGATIVE
PROTEIN: 100 mg/dL — AB
SPECIFIC GRAVITY, URINE: 1.032 — AB (ref 1.005–1.030)
pH: 5 (ref 5.0–8.0)

## 2015-09-24 LAB — COMPREHENSIVE METABOLIC PANEL
ALK PHOS: 73 U/L (ref 38–126)
ALT: 10 U/L — AB (ref 14–54)
ANION GAP: 10 (ref 5–15)
AST: 16 U/L (ref 15–41)
Albumin: 4.4 g/dL (ref 3.5–5.0)
BUN: 13 mg/dL (ref 6–20)
CALCIUM: 9.4 mg/dL (ref 8.9–10.3)
CO2: 23 mmol/L (ref 22–32)
CREATININE: 0.72 mg/dL (ref 0.44–1.00)
Chloride: 103 mmol/L (ref 101–111)
Glucose, Bld: 110 mg/dL — ABNORMAL HIGH (ref 65–99)
Potassium: 3.3 mmol/L — ABNORMAL LOW (ref 3.5–5.1)
Sodium: 136 mmol/L (ref 135–145)
Total Bilirubin: 0.6 mg/dL (ref 0.3–1.2)
Total Protein: 8.5 g/dL — ABNORMAL HIGH (ref 6.5–8.1)

## 2015-09-24 LAB — PREGNANCY, URINE: Preg Test, Ur: NEGATIVE

## 2015-09-24 LAB — LIPASE, BLOOD: LIPASE: 21 U/L (ref 11–51)

## 2015-09-24 MED ORDER — CIPROFLOXACIN HCL 500 MG PO TABS: 500.0000 mg | ORAL_TABLET | Freq: Two times a day (BID) | ORAL | Status: AC

## 2015-09-24 MED ORDER — SODIUM CHLORIDE 0.9 % IV BOLUS (SEPSIS)
1000.0000 mL | Freq: Once | INTRAVENOUS | Status: AC
  Administered 2015-09-24: 1000 mL via INTRAVENOUS

## 2015-09-24 MED ORDER — ONDANSETRON 4 MG PO TBDP
4.0000 mg | ORAL_TABLET | Freq: Once | ORAL | Status: AC
  Administered 2015-09-24: 4 mg via ORAL

## 2015-09-24 MED ORDER — ONDANSETRON 4 MG PO TBDP
ORAL_TABLET | ORAL | Status: AC
  Administered 2015-09-24: 4 mg via ORAL
  Filled 2015-09-24: qty 1

## 2015-09-24 MED ORDER — DIATRIZOATE MEGLUMINE & SODIUM 66-10 % PO SOLN
15.0000 mL | Freq: Once | ORAL | Status: AC
  Administered 2015-09-24: 15 mL via ORAL

## 2015-09-24 MED ORDER — ONDANSETRON 4 MG PO TBDP
4.0000 mg | ORAL_TABLET | Freq: Once | ORAL | Status: AC | PRN
Start: 2015-09-24 — End: 2015-09-24
  Administered 2015-09-24: 4 mg via ORAL

## 2015-09-24 MED ORDER — IOPAMIDOL (ISOVUE-300) INJECTION 61%
100.0000 mL | Freq: Once | INTRAVENOUS | Status: AC | PRN
  Administered 2015-09-24: 100 mL via INTRAVENOUS
  Filled 2015-09-24: qty 100

## 2015-09-24 MED ORDER — METRONIDAZOLE 500 MG PO TABS
500.0000 mg | ORAL_TABLET | Freq: Once | ORAL | Status: AC
  Administered 2015-09-24: 500 mg via ORAL

## 2015-09-24 MED ORDER — METRONIDAZOLE 500 MG PO TABS: 500.0000 mg | ORAL_TABLET | Freq: Three times a day (TID) | ORAL | Status: AC

## 2015-09-24 MED ORDER — METRONIDAZOLE 500 MG PO TABS
ORAL_TABLET | ORAL | Status: AC
  Administered 2015-09-24: 500 mg via ORAL
  Filled 2015-09-24: qty 1

## 2015-09-24 MED ORDER — CIPROFLOXACIN HCL 500 MG PO TABS
500.0000 mg | ORAL_TABLET | Freq: Once | ORAL | Status: AC
  Administered 2015-09-24: 500 mg via ORAL

## 2015-09-24 MED ORDER — OXYCODONE-ACETAMINOPHEN 5-325 MG PO TABS: 1.0000 | ORAL_TABLET | Freq: Four times a day (QID) | ORAL | Status: DC | PRN

## 2015-09-24 MED ORDER — METOCLOPRAMIDE HCL 10 MG PO TABS: 10.0000 mg | ORAL_TABLET | Freq: Four times a day (QID) | ORAL | Status: DC | PRN

## 2015-09-24 MED ORDER — CIPROFLOXACIN HCL 500 MG PO TABS
ORAL_TABLET | ORAL | Status: AC
  Administered 2015-09-24: 500 mg via ORAL
  Filled 2015-09-24: qty 1

## 2015-09-24 MED ORDER — ONDANSETRON 4 MG PO TBDP
ORAL_TABLET | ORAL | Status: AC
  Filled 2015-09-24: qty 1

## 2015-09-24 NOTE — ED Provider Notes (Signed)
[facility] Emergency Department Provider Note   ____________________________________________  Time seen: Approximately 540 PM  I have reviewed the triage vital signs and the nursing notes.   HISTORY  Chief Complaint Abdominal Pain; Emesis; and Diarrhea   HPI Emily Parker is a 40 y.o. female with a history of chronic back pain who is presenting to the emergency department today with 1 day of nausea vomiting, diarrhea as well as lower abdominal pain. She said that she has a child who was diagnosed with strep yesterday but without any gastrointestinal symptoms. She is denying any fever, runny nose, sore throat or ear pain. She says she has had over 10 episodes of vomitus as well as diarrhea. Says the last one was prior to arrival to the emergency department here. She's had a dose of Zofran and says that the nausea has decreased as well as the lower abdominal pain. Says the pain is minimal lower as well as left lower. Denies any vaginal discharge or bleeding. Is that the pain is cramping and is mild at this time. No radiation of the pain.Denies any blood in the vomitus or diarrhea. No recent antibiotics.   History reviewed. No pertinent past medical history.  There are no active problems to display for this patient.   Past Surgical History  Procedure Laterality Date  . Back surgery      Current Outpatient Rx  Name  Route  Sig  Dispense  Refill  . cetirizine (ZYRTEC) 10 MG tablet   Oral   Take 10 mg by mouth daily.           Allergies Ibuprofen and Penicillins  History reviewed. No pertinent family history.  Social History Social History  Substance Use Topics  . Smoking status: Current Every Day Smoker  . Smokeless tobacco: None  . Alcohol Use: Yes     Comment: occasional     Review of Systems Constitutional: No fever/chills Eyes: No visual changes. ENT: No sore throat. Cardiovascular: Denies chest pain. Respiratory: Denies shortness  of breath. Gastrointestinal: No constipation. Genitourinary: Negative for dysuria. Musculoskeletal: Negative for back pain. Skin: Negative for rash. Neurological: Negative for headaches, focal weakness or numbness.  10-point ROS otherwise negative.  ____________________________________________   PHYSICAL EXAM:  VITAL SIGNS: ED Triage Vitals  Enc Vitals Group     BP 09/24/15 1245 156/75 mmHg     Pulse Rate 09/24/15 1245 108     Resp 09/24/15 1245 20     Temp 09/24/15 1245 98.1 F (36.7 C)     Temp Source 09/24/15 1245 Oral     SpO2 09/24/15 1245 98 %     Weight 09/24/15 1245 190 lb (86.183 kg)     Height 09/24/15 1245 5\' 6"  (1.676 m)     Head Cir --      Peak Flow --      Pain Score 09/24/15 1246 8     Pain Loc --      Pain Edu? --      Excl. in GC? --     Constitutional: Alert and oriented. Well appearing and in no acute distress. Eyes: Conjunctivae are normal. PERRL. EOMI. Head: Atraumatic. Nose: No congestion/rhinnorhea. Mouth/Throat: Mucous membranes are moist.   Neck: No stridor.   Cardiovascular: Normal rate, regular rhythm. Grossly normal heart sounds.  Good peripheral circulation. Respiratory: Normal respiratory effort.  No retractions. Lungs CTAB. Gastrointestinal: Soft With mild to moderate suprapubic as well as left lower quadrant tenderness. No rebound or  guarding. No distention. No CVA tenderness. Musculoskeletal: No lower extremity tenderness nor edema.  Neurologic:  Normal speech and language. No gross focal neurologic deficits are appreciated.  Skin:  Skin is warm, dry and intact. No rash noted. Psychiatric: Mood and affect are normal. Speech and behavior are normal.  ____________________________________________   LABS (all labs ordered are listed, but only abnormal results are displayed)  Labs Reviewed  COMPREHENSIVE METABOLIC PANEL - Abnormal; Notable for the following:    Potassium 3.3 (*)    Glucose, Bld 110 (*)    Total Protein 8.5 (*)     ALT 10 (*)    All other components within normal limits  URINALYSIS COMPLETEWITH MICROSCOPIC ([facility] ONLY) - Abnormal; Notable for the following:    Color, Urine YELLOW (*)    APPearance HAZY (*)    Ketones, ur 1+ (*)    Specific Gravity, Urine 1.032 (*)    Hgb urine dipstick 1+ (*)    Protein, ur 100 (*)    Bacteria, UA RARE (*)    Squamous Epithelial / LPF 0-5 (*)    All other components within normal limits  CBC WITH DIFFERENTIAL/PLATELET - Abnormal; Notable for the following:    WBC 16.4 (*)    MCV 78.5 (*)    MCH 25.8 (*)    RDW 16.3 (*)    Neutro Abs 13.8 (*)    Monocytes Absolute 1.5 (*)    All other components within normal limits  LIPASE, BLOOD  PREGNANCY, URINE   ____________________________________________  EKG   ____________________________________________  RADIOLOGY     CT Abdomen Pelvis W Contrast (Final result) Result time: 09/24/15 21:17:22   Final result by Rad Results In Interface (09/24/15 21:17:22)   Narrative:   CLINICAL DATA: Lower abdominal pain for 1 day. Nausea and vomiting  EXAM: CT ABDOMEN AND PELVIS WITH CONTRAST  TECHNIQUE: Multidetector CT imaging of the abdomen and pelvis was performed using the standard protocol following bolus administration of intravenous contrast. Oral contrast was also administered.  CONTRAST: ISOVUE-300 IOPAMIDOL (ISOVUE-300) INJECTION 61%  COMPARISON: None.  FINDINGS: Lower chest: Lung bases are clear.  Hepatobiliary: No focal liver lesions are identified. Gallbladder wall is not appreciably thickened. There is no biliary duct dilatation.  Pancreas: There is no pancreatic mass or inflammatory focus.  Spleen: No splenic lesions are evident.  Adrenals/Urinary Tract: Adrenals appear normal bilaterally. Kidneys bilaterally show no evidence of mass or hydronephrosis on either side. There is no renal or ureteral calculus on either side. Urinary bladder is midline with wall thickness within  normal limits.  Stomach/Bowel: There is wall thickening in the distal descending and proximal most aspect sigmoid colon with an irregular diverticulum noted along the medial wall of the distal descending colon. There is localized bowel wall and mesenteric thickening in the region of the distal descending colon and proximal sigmoid colon with localized fluid in the left lateral conal fascia region. There is no demonstrable perforation or abscess. Elsewhere there is no other bowel wall thickening. There are small diverticulum more distally in the sigmoid region without diverticulitis in these regions. There is no bowel obstruction. No free air or portal venous air.  Vascular/Lymphatic: There is no demonstrable abdominal aortic aneurysm. No vascular lesions are identified on this study. There is no adenopathy in the abdomen or pelvis.  Reproductive: The uterus is anteverted. There is no pelvic mass or pelvic fluid collection. Endometrium measures 19 mm in thickness, mildly abnormal. There is no pelvic mass. There is a  small amount of free fluid in the cul-de-sac.  Other: Appendix appears normal. No abscess is evident in the abdomen pelvis. There is a fairly small focal ventral hernia containing fat only.  Musculoskeletal: There is postoperative change at L5 and S1. 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 There appear to be pars defects at this level. There are no blastic or lytic bone lesions. There is no intramuscular or abdominal wall lesion.  IMPRESSION: Diverticulitis involving the distal descending and proximal most aspect sigmoid colon as described. A small amount of fluid tracks into the adjacent lateral conal fascia on the left. No abscess or perforation evident.  No bowel obstruction. No abscess elsewhere in the abdomen or pelvis. Appendix appears normal.  No renal or ureteral calculus. No  hydronephrosis.  Arthropathy in the lumbar region. 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 Postoperative change at L5 and S1.  There is a ventral hernia containing fat but no bowel.   Electronically Signed By: Bretta BangWilliam Woodruff III M.D. On: 09/24/2015 21:17       ____________________________________________   PROCEDURES   ____________________________________________   INITIAL IMPRESSION / ASSESSMENT AND PLAN / ED COURSE  Pertinent labs & imaging results that were available during my care of the patient were reviewed by me and considered in my medical decision making (see chart for details).  ----------------------------------------- 9:47 PM on 09/24/2015 -----------------------------------------  Patient is resting comfortably and is now tolerating by mouth fluids. CAT scan showed diverticulitis without any abscess or perforation. She is resting comfortably at this time and her heart rate is now 93 bpm. I will discharge her on by mouth Cipro as well as Flagyl. I will also be giving her Reglan and Percocet for nausea and pain control respectively. She knows to return for any worsening or concerning symptoms. We discussed her lab values as well as her diagnosis. She'll be following up with her primary care doctor. ____________________________________________   FINAL CLINICAL IMPRESSION(S) / ED DIAGNOSES  Diverticulitis. Nausea vomiting and diarrhea.    NEW MEDICATIONS STARTED DURING THIS VISIT:  New Prescriptions   No medications on file     Note:  This document was prepared using Dragon voice recognition software and may include unintentional dictation errors.    Myrna Blazeravid Matthew Schaevitz, MD 09/24/15 [phone]

## 2015-09-24 NOTE — ED Notes (Signed)
Pt in via triage with complaints of abdominal pain since yesterday.  Pt reports N/V/D since then as well.  Pt reports decreased appetite.  Pt A/Ox4, no immediate distress at this time.

## 2015-09-24 NOTE — ED Notes (Signed)
Pt presents with lower abdominal pain x 2 days, n/v/d today. Son diagnosed with strep yesterday. Pt alert & oriented with NAD noted.

## 2015-09-24 NOTE — Discharge Instructions (Signed)
Diverticulitis Diverticulitis is inflammation or infection of small pouches in your colon that form when you have a condition called diverticulosis. The pouches in your colon are called diverticula. Your colon, or large intestine, is where water is absorbed and stool is formed. Complications of diverticulitis can include:  Bleeding.  Severe infection.  Severe pain.  Perforation of your colon.  Obstruction of your colon. CAUSES  Diverticulitis is caused by bacteria. Diverticulitis happens when stool becomes trapped in diverticula. This allows bacteria to grow in the diverticula, which can lead to inflammation and infection. RISK FACTORS People with diverticulosis are at risk for diverticulitis. Eating a diet that does not include enough fiber from fruits and vegetables may make diverticulitis more likely to develop. SYMPTOMS  Symptoms of diverticulitis may include:  Abdominal pain and tenderness. The pain is normally located on the left side of the abdomen, but may occur in other areas.  Fever and chills.  Bloating.  Cramping.  Nausea.  Vomiting.  Constipation.  Diarrhea.  Blood in your stool. DIAGNOSIS  Your health care provider will ask you about your medical history and do a physical exam. You may need to have tests done because many medical conditions can cause the same symptoms as diverticulitis. Tests may include:  Blood tests.  Urine tests.  Imaging tests of the abdomen, including X-rays and CT scans. When your condition is under control, your health care provider may recommend that you have a colonoscopy. A colonoscopy can show how severe your diverticula are and whether something else is causing your symptoms. TREATMENT  Most cases of diverticulitis are mild and can be treated at home. Treatment may include:  Taking over-the-counter pain medicines.  Following a clear liquid diet.  Taking antibiotic medicines by mouth for 7-10 days. More severe cases may  be treated at a hospital. Treatment may include:  Not eating or drinking.  Taking prescription pain medicine.  Receiving antibiotic medicines through an IV tube.  Receiving fluids and nutrition through an IV tube.  Surgery. HOME CARE INSTRUCTIONS   Follow your health care provider's instructions carefully.  Follow a full liquid diet or other diet as directed by your health care provider. After your symptoms improve, your health care provider may tell you to change your diet. He or she may recommend you eat a high-fiber diet. Fruits and vegetables are good sources of fiber. Fiber makes it easier to pass stool.  Take fiber supplements or probiotics as directed by your health care provider.  Only take medicines as directed by your health care provider.  Keep all your follow-up appointments. SEEK MEDICAL CARE IF:   Your pain does not improve.  You have a hard time eating food.  Your bowel movements do not return to normal. SEEK IMMEDIATE MEDICAL CARE IF:   Your pain becomes worse.  Your symptoms do not get better.  Your symptoms suddenly get worse.  You have a fever.  You have repeated vomiting.  You have bloody or black, tarry stools. MAKE SURE YOU:   Understand these instructions.  Will watch your condition.  Will get help right away if you are not doing well or get worse.   This information is not intended to replace advice given to you by your health care provider. Make sure you discuss any questions you have with your health care provider.   Document Released: 12/22/2004 Document Revised: 03/19/2013 Document Reviewed: 02/06/2013 Elsevier Interactive Patient Education 2016 [employer]  Diarrhea Diarrhea is watery poop (stool). It can  make you feel weak, tired, thirsty, or give you a dry mouth (signs of dehydration). Watery poop is a sign of another problem, most often an infection. It often lasts 2-3 days. It can last longer if it is a sign of something  serious. Take care of yourself as told by your doctor. HOME CARE   Drink 1 cup (8 ounces) of fluid each time you have watery poop.  Do not drink the following fluids:  Those that contain simple sugars (fructose, glucose, galactose, lactose, sucrose, maltose).  Sports drinks.  Fruit juices.  Whole milk products.  Sodas.  Drinks with caffeine (coffee, tea, soda) or alcohol.  Oral rehydration solution may be used if the doctor says it is okay. You may make your own solution. Follow this recipe:   - teaspoon table salt.   teaspoon baking soda.   teaspoon salt substitute containing potassium chloride.  1 tablespoons sugar.  1 liter (34 ounces) of water.  Avoid the following foods:  High fiber foods, such as raw fruits and vegetables.  Nuts, seeds, and whole grain breads and cereals.   Those that are sweetened with sugar alcohols (xylitol, sorbitol, mannitol).  Try eating the following foods:  Starchy foods, such as rice, toast, pasta, low-sugar cereal, oatmeal, baked potatoes, crackers, and bagels.  Bananas.  Applesauce.  Eat probiotic-rich foods, such as yogurt and milk products that are fermented.  Wash your hands well after each time you have watery poop.  Only take medicine as told by your doctor.  Take a warm bath to help lessen burning or pain from having watery poop. GET HELP RIGHT AWAY IF:   You cannot drink fluids without throwing up (vomiting).  You keep throwing up.  You have blood in your poop, or your poop looks black and tarry.  You do not pee (urinate) in 6-8 hours, or there is only a small amount of very dark pee.  You have belly (abdominal) pain that gets worse or stays in the same spot (localizes).  You are weak, dizzy, confused, or light-headed.  You have a very bad headache.  Your watery poop gets worse or does not get better.  You have a fever or lasting symptoms for more than 2-3 days.  You have a fever and your symptoms  suddenly get worse. MAKE SURE YOU:   Understand these instructions.  Will watch your condition.  Will get help right away if you are not doing well or get worse.   This information is not intended to replace advice given to you by your health care provider. Make sure you discuss any questions you have with your health care provider.   Document Released: 08/31/2007 Document Revised: 04/04/2014 Document Reviewed: 11/20/2011 Elsevier Interactive Patient Education 2016 [employer] Inc.  Nausea and Vomiting Nausea is a sick feeling that often comes before throwing up (vomiting). Vomiting is a reflex where stomach contents come out of your mouth. Vomiting can cause severe loss of body fluids (dehydration). Children and elderly adults can become dehydrated quickly, especially if they also have diarrhea. Nausea and vomiting are symptoms of a condition or disease. It is important to find the cause of your symptoms. CAUSES   Direct irritation of the stomach lining. This irritation can result from increased acid production (gastroesophageal reflux disease), infection, food poisoning, taking certain medicines (such as nonsteroidal anti-inflammatory drugs), alcohol use, or tobacco use.  Signals from the brain.These signals could be caused by a headache, heat exposure, an inner ear disturbance, increased pressure  in the brain from injury, infection, a tumor, or a concussion, pain, emotional stimulus, or metabolic problems.  An obstruction in the gastrointestinal tract (bowel obstruction).  Illnesses such as diabetes, hepatitis, gallbladder problems, appendicitis, kidney problems, cancer, sepsis, atypical symptoms of a heart attack, or eating disorders.  Medical treatments such as chemotherapy and radiation.  Receiving medicine that makes you sleep (general anesthetic) during surgery. DIAGNOSIS Your caregiver may ask for tests to be done if the problems do not improve after a few days. Tests may also  be done if symptoms are severe or if the reason for the nausea and vomiting is not clear. Tests may include:  Urine tests.  Blood tests.  Stool tests.  Cultures (to look for evidence of infection).  X-rays or other imaging studies. Test results can help your caregiver make decisions about treatment or the need for additional tests. TREATMENT You need to stay well hydrated. Drink frequently but in small amounts.You may wish to drink water, sports drinks, clear broth, or eat frozen ice pops or gelatin dessert to help stay hydrated.When you eat, eating slowly may help prevent nausea.There are also some antinausea medicines that may help prevent nausea. HOME CARE INSTRUCTIONS   Take all medicine as directed by your caregiver.  If you do not have an appetite, do not force yourself to eat. However, you must continue to drink fluids.  If you have an appetite, eat a normal diet unless your caregiver tells you differently.  Eat a variety of complex carbohydrates (rice, wheat, potatoes, bread), lean meats, yogurt, fruits, and vegetables.  Avoid high-fat foods because they are more difficult to digest.  Drink enough water and fluids to keep your urine clear or pale yellow.  If you are dehydrated, ask your caregiver for specific rehydration instructions. Signs of dehydration may include:  Severe thirst.  Dry lips and mouth.  Dizziness.  Dark urine.  Decreasing urine frequency and amount.  Confusion.  Rapid breathing or pulse. SEEK IMMEDIATE MEDICAL CARE IF:   You have blood or brown flecks (like coffee grounds) in your vomit.  You have black or bloody stools.  You have a severe headache or stiff neck.  You are confused.  You have severe abdominal pain.  You have chest pain or trouble breathing.  You do not urinate at least once every 8 hours.  You develop cold or clammy skin.  You continue to vomit for longer than 24 to 48 hours.  You have a fever. MAKE SURE  YOU:   Understand these instructions.  Will watch your condition.  Will get help right away if you are not doing well or get worse.   This information is not intended to replace advice given to you by your health care provider. Make sure you discuss any questions you have with your health care provider.   Document Released: 03/14/2005 Document Revised: 06/06/2011 Document Reviewed: 08/11/2010 Elsevier Interactive Patient Education [employer]

## 2016-03-25 ENCOUNTER — Emergency Department
Admission: EM | Admit: 2016-03-25 | Discharge: 2016-03-25 | Disposition: A | Discharge status: Home / Self Care | Payer: Self-pay | Attending: Emergency Medicine | Admitting: Emergency Medicine

## 2016-03-25 ENCOUNTER — Encounter: Payer: Self-pay | Admitting: Emergency Medicine

## 2016-03-25 DIAGNOSIS — F17200 Nicotine dependence, unspecified, uncomplicated: Secondary | ICD-10-CM | POA: Insufficient documentation

## 2016-03-25 DIAGNOSIS — J0100 Acute maxillary sinusitis, unspecified: Secondary | ICD-10-CM | POA: Insufficient documentation

## 2016-03-25 MED ORDER — DOXYCYCLINE HYCLATE 100 MG PO TABS: 100.0000 mg | ORAL_TABLET | Freq: Two times a day (BID) | ORAL | 0 refills | Status: DC

## 2016-03-25 MED ORDER — FLUTICASONE PROPIONATE 50 MCG/ACT NA SUSP: 1.0000 | Freq: Two times a day (BID) | NASAL | 0 refills | Status: DC

## 2016-03-25 MED ORDER — CETIRIZINE HCL 10 MG PO TABS: 10.0000 mg | ORAL_TABLET | Freq: Every day | ORAL | 0 refills | Status: AC

## 2016-03-25 NOTE — ED Triage Notes (Signed)
Sinus congestion and face pain.  No resp distress

## 2016-03-25 NOTE — ED Provider Notes (Signed)
[facility] Emergency Department Provider Note  ____________________________________________  Time seen: Approximately 3:46 PM  I have reviewed the triage vital signs and the nursing notes.   HISTORY  Chief Complaint Facial Pain    HPI Emily Parker is a 40 y.o. female who presents emergency department complaining of sinus congestion, facial pressure, low-grade fevers and chills. Patient states that over the past 4-5 days she has developed increasing sinus pressure and congestion. Patient has tried Sudafed and Tylenol at home. This does not resolve symptoms. Patient denies any headache, visual changes, neck pain, chest pain, shortness of breath, abdominal pain, nausea or vomiting. Patient denies any recurrent history of sinus infections or severe allergies.   History reviewed. No pertinent past medical history.  There are no active problems to display for this patient.   Past Surgical History:  Procedure Laterality Date  . BACK SURGERY      Prior to Admission medications   Medication Sig Start Date End Date Taking? Authorizing Provider  cetirizine (ZYRTEC) 10 MG tablet Take 1 tablet (10 mg total) by mouth daily. 03/25/16   Delorise RoyalsJonathan D Exander Shaul, PA-C  doxycycline (VIBRA-TABS) 100 MG tablet Take 1 tablet (100 mg total) by mouth 2 (two) times daily. 03/25/16   Delorise RoyalsJonathan D Anaid Haney, PA-C  fluticasone (FLONASE) 50 MCG/ACT nasal spray Place 1 spray into both nostrils 2 (two) times daily. 03/25/16   Delorise RoyalsJonathan D Gabrielle Mester, PA-C  metoCLOPramide (REGLAN) 10 MG tablet Take 1 tablet (10 mg total) by mouth every 6 (six) hours as needed. 09/24/15   Myrna Blazeravid Matthew Schaevitz, MD  oxyCODONE-acetaminophen (ROXICET) 5-325 MG tablet Take 1-2 tablets by mouth every 6 (six) hours as needed. 09/24/15   Myrna Blazeravid Matthew Schaevitz, MD    Allergies Ibuprofen and Penicillins  No family history on file.  Social History Social History  Substance Use Topics  . Smoking status: Current  Every Day Smoker  . Smokeless tobacco: Never Used  . Alcohol use Yes     Comment: occasional      Review of Systems  Constitutional: No fever/chills Eyes: No visual changes. No discharge ENT: [facility] for nasal congestion and sinus pressure to the right side of the face Cardiovascular: no chest pain. Respiratory: no cough. No SOB. Gastrointestinal: No abdominal pain.  No nausea, no vomiting.  No diarrhea.  No constipation. Musculoskeletal: Negative for musculoskeletal pain. Skin: Negative for rash, abrasions, lacerations, ecchymosis. Neurological: Negative for headaches, focal weakness or numbness. 10-point ROS otherwise negative.  ____________________________________________   PHYSICAL EXAM:  VITAL SIGNS: ED Triage Vitals  Enc Vitals Group     BP 03/25/16 1525 (!) 149/100     Pulse Rate 03/25/16 1525 84     Resp 03/25/16 1525 18     Temp 03/25/16 1525 97.5 F (36.4 C)     Temp Source 03/25/16 1525 Oral     SpO2 03/25/16 1525 98 %     Weight 03/25/16 1529 200 lb (90.7 kg)     Height 03/25/16 1529 5\' 8"  (1.727 m)     Head Circumference --      Peak Flow --      Pain Score --      Pain Loc --      Pain Edu? --      Excl. in GC? --      Constitutional: Alert and oriented. Well appearing and in no acute distress. Eyes: Conjunctivae are normal. PERRL. EOMI. Head: Atraumatic. ENT:      Ears:  Nose: Moderate congestion/rhinnorhea. TMs are erythematous and edematous. Patient is very tender to palpation and percussion over the right maxillary sinuses.      Mouth/Throat: Mucous membranes are moist. Oropharynx is nonerythematous and nonedematous. Uvula is midline. Tonsils are unremarkable. Neck: No stridor. Neck is supple with full range of motion Hematological/Lymphatic/Immunilogical: No cervical lymphadenopathy. Cardiovascular: Normal rate, regular rhythm. Normal S1 and S2.  Good peripheral circulation. Respiratory: Normal respiratory effort without tachypnea or  retractions. Lungs CTAB. Good air entry to the bases with no decreased or absent breath sounds. Musculoskeletal: Full range of motion to all extremities. No gross deformities appreciated. Neurologic:  Normal speech and language. No gross focal neurologic deficits are appreciated.  Skin:  Skin is warm, dry and intact. No rash noted. Psychiatric: Mood and affect are normal. Speech and behavior are normal. Patient exhibits appropriate insight and judgement.   ____________________________________________   LABS (all labs ordered are listed, but only abnormal results are displayed)  Labs Reviewed - No data to display ____________________________________________  EKG   ____________________________________________  RADIOLOGY   No results found.  ____________________________________________    PROCEDURES  Procedure(s) performed:    Procedures    Medications - No data to display   ____________________________________________   INITIAL IMPRESSION / ASSESSMENT AND PLAN / ED COURSE  Pertinent labs & imaging results that were available during my care of the patient were reviewed by me and considered in my medical decision making (see chart for details).  Review of the [place] CSRS was performed in accordance of the [facility] prior to dispensing any controlled drugs.  Clinical Course     Patient's diagnosis is consistent with Acute bacterial maxillary sinusitis. Exam is reassuring with no indication for labs or imaging at this time.. Patient will be discharged home with prescriptions for doxycycline as patient is allergic to penicillin based antibiotics, Flonase, Zyrtec. Patient is to follow up with primary care as needed or otherwise directed. Patient is given ED precautions to return to the ED for any worsening or new symptoms.     ____________________________________________  FINAL CLINICAL IMPRESSION(S) / ED DIAGNOSES  Final diagnoses:  Acute non-recurrent maxillary  sinusitis      NEW MEDICATIONS STARTED DURING THIS VISIT:  New Prescriptions   CETIRIZINE (ZYRTEC) 10 MG TABLET    Take 1 tablet (10 mg total) by mouth daily.   DOXYCYCLINE (VIBRA-TABS) 100 MG TABLET    Take 1 tablet (100 mg total) by mouth 2 (two) times daily.   FLUTICASONE (FLONASE) 50 MCG/ACT NASAL SPRAY    Place 1 spray into both nostrils 2 (two) times daily.        This chart was dictated using voice recognition software/Dragon. Despite best efforts to proofread, errors can occur which can change the meaning. Any change was purely unintentional.    Racheal PatchesJonathan D Yanice Maqueda, PA-C 03/25/16 1555    Loleta Roseory Forbach, MD 03/25/16 2018

## 2016-03-26 ENCOUNTER — Emergency Department: Payer: Self-pay

## 2016-03-26 ENCOUNTER — Emergency Department
Admission: EM | Admit: 2016-03-26 | Discharge: 2016-03-26 | Disposition: A | Discharge status: Home / Self Care | Payer: Self-pay | Attending: Emergency Medicine | Admitting: Emergency Medicine

## 2016-03-26 DIAGNOSIS — F17200 Nicotine dependence, unspecified, uncomplicated: Secondary | ICD-10-CM | POA: Insufficient documentation

## 2016-03-26 DIAGNOSIS — K047 Periapical abscess without sinus: Secondary | ICD-10-CM | POA: Insufficient documentation

## 2016-03-26 LAB — HCG, QUANTITATIVE, PREGNANCY: hCG, Beta Chain, Quant, S: <1 m[IU]/mL (ref <5)

## 2016-03-26 LAB — CBC
HEMATOCRIT: 40.8 % (ref 35.0–47.0)
HEMOGLOBIN: 13.7 g/dL (ref 12.0–16.0)
MCH: 25.9 pg — ABNORMAL LOW (ref 26.0–34.0)
MCHC: 33.6 g/dL (ref 32.0–36.0)
MCV: 77.2 fL — AB (ref 80.0–100.0)
Platelets: 298 10*3/uL (ref 150–440)
RBC: 5.29 MIL/uL — AB (ref 3.80–5.20)
RDW: 16.2 % — ABNORMAL HIGH (ref 11.5–14.5)
WBC: 9.2 10*3/uL (ref 3.6–11.0)

## 2016-03-26 LAB — BASIC METABOLIC PANEL
ANION GAP: 12 (ref 5–15)
BUN: 7 mg/dL (ref 6–20)
CO2: 17 mmol/L — AB (ref 22–32)
Calcium: 9.4 mg/dL (ref 8.9–10.3)
Chloride: 108 mmol/L (ref 101–111)
Creatinine, Ser: 0.98 mg/dL (ref 0.44–1.00)
GFR calc Af Amer: >60 mL/min (ref >60)
GFR calc non Af Amer: >60 mL/min (ref >60)
GLUCOSE: 100 mg/dL — AB (ref 65–99)
POTASSIUM: 3.4 mmol/L — AB (ref 3.5–5.1)
Sodium: 137 mmol/L (ref 135–145)

## 2016-03-26 MED ORDER — IOPAMIDOL (ISOVUE-300) INJECTION 61%
75.0000 mL | Freq: Once | INTRAVENOUS | Status: AC | PRN
  Administered 2016-03-26: 75 mL via INTRAVENOUS

## 2016-03-26 MED ORDER — OXYCODONE-ACETAMINOPHEN 5-325 MG PO TABS
1.0000 | ORAL_TABLET | ORAL | Status: AC
  Administered 2016-03-26: 1 via ORAL
  Filled 2016-03-26: qty 1

## 2016-03-26 MED ORDER — OXYCODONE-ACETAMINOPHEN 5-325 MG PO TABS: 1.0000 | ORAL_TABLET | Freq: Four times a day (QID) | ORAL | 0 refills | Status: DC | PRN

## 2016-03-26 MED ORDER — CLINDAMYCIN HCL 300 MG PO CAPS: 300.0000 mg | ORAL_CAPSULE | Freq: Three times a day (TID) | ORAL | 0 refills | Status: DC

## 2016-03-26 NOTE — Discharge Instructions (Signed)
You have been seen in the Emergency Department (ED) today for dental pain.  Please take your prescribed antibiotic.  You may take pain medication as needed but ONLY as prescribed.  You should also take over-the-counter pain medication such as ibuprofen according to the label instructions unless a doctor has previously told you to avoid this type of medication (due to stomach ulcers, for example).  Please see you dentist as soon as possible; only a dentist will be able to fix your problem(s).  Please see below for dental follow up options.  Return to the ED if you develop worsening pain, fever, pus/drainage, difficulty breathing, or other symptoms that concern you.  OPTIONS FOR DENTAL FOLLOW UP CARE  [place] Department of Health and Human Services - Local Safety Net Dental Clinics http://www.ncdhhs.gov/dph/oralhealth/services/safetynetclinics.htm   [facility] ([phone])  [facility] ([phone])  [facility] ([phone])  [facility] ([phone])  [facility] ([phone]) This clinic caters to the indigent population and is on a lottery system. Location: [facility], [address], [place] Clinic Hours: Wednesdays from 6pm - 9pm, patients seen by a lottery system. For dates, call or go to [email or web address]/[facility]/[email or web address] Services: Cleanings, fillings and simple extractions. Payment Options: DENTAL WORK IS FREE OF CHARGE. Bring proof of income or support. Best way to get seen: Arrive at 5:15 pm - this is a lottery, NOT first come/first serve, so arriving earlier will not increase your chances of being seen.     [facility] [phone] Select option 1 for emergencies   Location: [facility], [address], [place] Clinic Hours: No walk-ins accepted - call the day before to schedule an appointment. Check  in times are 9:30 am and 1:30 pm. Services: Simple extractions, temporary fillings, pulpectomy/pulp debridement, uncomplicated abscess drainage. Payment Options: PAYMENT IS DUE AT THE TIME OF SERVICE.  Fee is usually $100-200, additional surgical procedures (e.g. abscess drainage) may be extra. Cash, checks, Visa/MasterCard accepted.  Can file Medicaid if patient is covered for dental - patient should call case worker to check. No discount for [facility] patients. Best way to get seen: MUST call the day before and get onto the schedule. Can usually be seen the next 1-2 days. No walk-ins accepted.     [facility] [phone]   Location: [facility], [address], [place] Clinic Hours: M, W, Th, F 8am or 1:30pm, Tues 9a or 1:30 - first come/first served. Services: Simple extractions, temporary fillings, uncomplicated abscess drainage.  You do not need to be an [place] resident. Payment Options: PAYMENT IS DUE AT THE TIME OF SERVICE. Dental insurance, otherwise sliding scale - bring proof of income or support. Depending on income and treatment needed, cost is usually $50-200. Best way to get seen: Arrive early as it is first come/first served.     [facility] [phone]   Location: [address] Clinic Hours: Mon-Thu 8a-5p Services: Most basic dental services including extractions and fillings. Payment Options: PAYMENT IS DUE AT THE TIME OF SERVICE. Sliding scale, up to 50% off - bring proof if income or support. Medicaid with dental option accepted. Best way to get seen: Call to schedule an appointment, can usually be seen within 2 weeks OR they will try to see walk-ins - show up at 8a or 2p (you may have to wait).       [facility] [phone] [place] RESIDENTS ONLY   Location: [facility], [address], [place], [place]  [address] Clinic Hours: By appointment only. Monday - Thursday 8am-5pm, Friday 8am-12pm Services: Cleanings, fillings, extractions. Payment Options: PAYMENT IS DUE AT THE TIME OF SERVICE. Cash, Visa or MasterCard. Sliding scale - $30 minimum per service. Best way to get seen: Come in to office, complete packet and make an appointment - need proof of income or support monies for each household member and proof of [place] residence. Usually takes about a month to get in.     [facility] [phone]   Location: [address] [place] Clinic Hours: Walk-in Urgent Care Dental Services are offered Monday-Friday mornings only. The numbers of emergencies accepted daily is limited to the number of providers available. Maximum 15 - Mondays, Wednesdays & Thursdays Maximum 10 - Tuesdays & Fridays Services: You do not need to be a [place] resident to be seen for a dental emergency. Emergencies are defined as pain, swelling, abnormal bleeding, or dental trauma. Walkins will receive x-rays if needed. NOTE: Dental cleaning is not an emergency. Payment Options: PAYMENT IS DUE AT THE TIME OF SERVICE. Minimum co-pay is $40.00 for uninsured patients. Minimum co-pay is $3.00 for Medicaid with dental coverage. Dental Insurance is accepted and must be presented at time of visit. Medicare does not cover dental. Forms of payment: Cash, credit card, checks. Best way to get seen: If not previously registered with the clinic, walk-in dental registration begins at 7:15 am and is on a first come/first serve basis. If previously registered with the clinic, call to make an appointment.     [facility] [phone] [place] RESIDENTS ONLY   Location: [address], [place], [place] Clinic Hours: Mon-Thu 10a-2p Services: Extractions only! Payment Options: FREE (donations accepted) - bring proof of income or support Best way to get seen: Call  and schedule an appointment OR come at 8am on the 1st Monday of every month (except for holidays) when it is first come/first served.     [facility] [phone]   Location: [address], [place] Clinic Hours: Friday mornings Services, Payment Options, Best way to get seen: Call for info   

## 2016-03-26 NOTE — ED Triage Notes (Signed)
Pt c/o pain and right sided facial swelling that started 4 days ago. Pt seen here yesterday, given antibiotics and pain medication. Reports pain and swelling not getting any better.VS stable.

## 2016-03-26 NOTE — ED Provider Notes (Signed)
[facility] Emergency Department Provider Note   ____________________________________________   First MD Initiated Contact with Patient 03/26/16 1908     (approximate)  I have reviewed the triage vital signs and the nursing notes.  HISTORY  Chief Complaint Facial Swelling  HPI Emily Parker is a 40 y.o. female reports no major medical history except for previous back surgery.  Over the last few days, patient reports she's had developed a pressure over the last 5 days with a right face, low-grade fevers and chills. She feels like there is congestion in her right sinus. She's been trying Sudafed, but has not seen much relief. She started antibiotics yesterday after being prescribed in the ER, and reports that she felt as though something popped and was draining in the upper part of her right jaw, and for a little while it was causing pain to radiate out across her face.  No trouble breathing. No trouble swallowing. No neck stiffness. No trauma. Currently taking doxycycline for one day   History reviewed. No pertinent past medical history.  There are no active problems to display for this patient.   Past Surgical History:  Procedure Laterality Date  . BACK SURGERY      Prior to Admission medications   Medication Sig Start Date End Date Taking? Authorizing Provider  cetirizine (ZYRTEC) 10 MG tablet Take 1 tablet (10 mg total) by mouth daily. 03/25/16   Delorise Royals Cuthriell, PA-C  clindamycin (CLEOCIN) 300 MG capsule Take 1 capsule (300 mg total) by mouth 3 (three) times daily. 03/26/16   Sharyn Creamer, MD  fluticasone (FLONASE) 50 MCG/ACT nasal spray Place 1 spray into both nostrils 2 (two) times daily. 03/25/16   Delorise Royals Cuthriell, PA-C  metoCLOPramide (REGLAN) 10 MG tablet Take 1 tablet (10 mg total) by mouth every 6 (six) hours as needed. 09/24/15   Myrna Blazer, MD  oxyCODONE-acetaminophen (ROXICET) 5-325 MG tablet Take 1 tablet by mouth  every 6 (six) hours as needed for severe pain. 03/26/16   Sharyn Creamer, MD    Allergies Ibuprofen and Penicillins  No family history on file.  Social History Social History  Substance Use Topics  . Smoking status: Current Every Day Smoker  . Smokeless tobacco: Never Used  . Alcohol use Yes     Comment: occasional     Review of Systems Constitutional: No fever/chills Eyes: No visual changes. ENT: No sore throat. Cardiovascular: Denies chest pain. Respiratory: Denies shortness of breath. Gastrointestinal: No abdominal pain.  No nausea, no vomiting.  Genitourinary: Negative for dysuria. Musculoskeletal: Negative for back pain. Skin: Negative for rash. Neurological: Negative for headaches, focal weakness or numbness.  10-point ROS otherwise negative.  ____________________________________________   PHYSICAL EXAM:  VITAL SIGNS: ED Triage Vitals  Enc Vitals Group     BP 03/26/16 1521 108/90     Pulse Rate 03/26/16 1521 (!) 126     Resp --      Temp 03/26/16 1521 97.6 F (36.4 C)     Temp Source 03/26/16 1521 Oral     SpO2 03/26/16 1521 98 %     Weight 03/26/16 1524 200 lb (90.7 kg)     Height 03/26/16 1524 5\' 8"  (1.727 m)     Head Circumference --      Peak Flow --      Pain Score --      Pain Loc --      Pain Edu? --      Excl. in  GC? --     Constitutional: Alert and oriented. Well appearing and in no acute distress. Eyes: Conjunctivae are normal. PERRL. EOMI. Head: Atraumatic. Nose: No congestion/rhinnorhea. Mouth/Throat: Mucous membranes are moist.  Oropharynx non-erythematous.Somewhat poor dentition, there is a focal area of mucosal inflammation at about tooth #4, tenderness to palpation across the gumline that what would've been tooth #4.  There is no intraoral edema, there is no elevation of the tongue, no swelling tenderness or redness across the face. Sinuses nontender  Neck: No stridor.   Cardiovascular: Normal rate, regular rhythm. Grossly normal  heart sounds.  Good peripheral circulation. Respiratory: Normal respiratory effort.  No retractions. Lungs CTAB. Gastrointestinal: Soft and nontender Musculoskeletal: No lower extremity tenderness nor edema.  No joint effusions. Neurologic:  Normal speech and language. No gross focal neurologic deficits are appreciated. No gait instability. Skin:  Skin is warm, dry and intact. No rash noted. Psychiatric: Mood and affect are normal. Speech and behavior are normal.  ____________________________________________   LABS (all labs ordered are listed, but only abnormal results are displayed)  Labs Reviewed  CBC - Abnormal; Notable for the following:       Result Value   RBC 5.29 (*)    MCV 77.2 (*)    MCH 25.9 (*)    RDW 16.2 (*)    All other components within normal limits  BASIC METABOLIC PANEL - Abnormal; Notable for the following:    Potassium 3.4 (*)    CO2 17 (*)    Glucose, Bld 100 (*)    All other components within normal limits  HCG, QUANTITATIVE, PREGNANCY   ____________________________________________  EKG   ____________________________________________  RADIOLOGY  Ct Maxillofacial W Contrast  Result Date: 03/26/2016 CLINICAL DATA:  Right-sided facial pain and swelling over the last 2 days. EXAM: CT MAXILLOFACIAL WITH CONTRAST TECHNIQUE: Multidetector CT imaging of the maxillofacial structures was performed. Multiplanar CT image reconstructions were also generated. A small metallic BB was placed on the right temple in order to reliably differentiate right from left. CONTRAST:  75mL ISOVUE-300 IOPAMIDOL (ISOVUE-300) INJECTION 61% COMPARISON:  Head CT 07/08/2007 FINDINGS: Osseous: No abnormal primary bone finding. There is dental and periodontal disease. There is lucency around the roots of tooth number 4. Orbits: Normal Sinuses: Mucosal thickening along the floor of the right maxillary sinus, probably secondary to the dental root disease. Soft tissues: No facial soft tissue  abnormality is seen. No evidence of facial cellulitis. Parotid and submandibular glands are normal. Slight prominence of level 1 and 2 lymph nodes on the right adjacent to the submandibular gland which could be reactive to mild inflammation. Limited intracranial: Negative IMPRESSION: No evidence of facial cellulitis. Slight prominence of the level 1 and level 2 lymph nodes on the right which could be reactive to a mild inflammatory process. Lucency around the roots of tooth number 4 which could be symptomatic. Mucosal thickening along the floor of the right maxillary sinus, probably secondary to this dental root disease. No evidence of orbital pathology. Electronically Signed   By: Paulina FusiMark  Shogry M.D.   On: 03/26/2016 20:33    ____________________________________________   PROCEDURES  Procedure(s) performed: None  Procedures  Critical Care performed: No  ____________________________________________   INITIAL IMPRESSION / ASSESSMENT AND PLAN / ED COURSE  Pertinent labs & imaging results that were available during my care of the patient were reviewed by me and considered in my medical decision making (see chart for details).  Patient presents for right-sided facial pain. Currently in doxycycline  for suspected sinusitis, my clinical exam seems more consistent with a possible dental abscess or caries. There is no evidence of "trench mouth" for acute complicating factor. She is stable, nontoxic, but does report at least moderate pain not relieved by Tylenol at home.  Noted slight tachycardia triage, after pain medication and reassessment at 9:30 PM her heart rate is 80 resting by palpation. She is comfortable reports her pain is improved.  I will prescribe the patient a narcotic pain medicine due to their condition which I anticipate will cause at least moderate pain short term. I discussed with the patient safe use of narcotic pain medicines, and that they are not to drive, work in dangerous  areas, or ever take more than prescribed (no more than 1 pill every 6 hours). We discussed that this is the type of medication that can be  overdosed on and the risks of this type of medicine. Patient is very agreeable to only use as prescribed and to never use more than prescribed.  Discussed careful treatment with the patient, she will discontinue doxycycline and will change to clindamycin as an antibiotic for better dental coverage. In addition, patient referred and strongly advised follow-up with dentist and [facility]. Discussed careful return precautions with the patient, she is not driving and understands not to drive while using oxycodone.  Clinical Course      ____________________________________________   FINAL CLINICAL IMPRESSION(S) / ED DIAGNOSES  Final diagnoses:  Dental abscess      NEW MEDICATIONS STARTED DURING THIS VISIT:  Discharge Medication List as of 03/26/2016  9:59 PM    START taking these medications   Details  clindamycin (CLEOCIN) 300 MG capsule Take 1 capsule (300 mg total) by mouth 3 (three) times daily., Starting Sat 03/26/2016, Print         Note:  This document was prepared using Dragon voice recognition software and may include unintentional dictation errors.     Sharyn CreamerMark Mateen Franssen, MD 03/26/16 [phone]

## 2016-06-11 ENCOUNTER — Encounter: Payer: Self-pay | Admitting: Emergency Medicine

## 2016-06-11 ENCOUNTER — Emergency Department
Admission: EM | Admit: 2016-06-11 | Discharge: 2016-06-11 | Disposition: A | Discharge status: Home / Self Care | Payer: Self-pay | Attending: Emergency Medicine | Admitting: Emergency Medicine

## 2016-06-11 DIAGNOSIS — R112 Nausea with vomiting, unspecified: Secondary | ICD-10-CM | POA: Insufficient documentation

## 2016-06-11 DIAGNOSIS — Z79899 Other long term (current) drug therapy: Secondary | ICD-10-CM | POA: Insufficient documentation

## 2016-06-11 DIAGNOSIS — R197 Diarrhea, unspecified: Secondary | ICD-10-CM | POA: Insufficient documentation

## 2016-06-11 DIAGNOSIS — F17210 Nicotine dependence, cigarettes, uncomplicated: Secondary | ICD-10-CM | POA: Insufficient documentation

## 2016-06-11 LAB — COMPREHENSIVE METABOLIC PANEL
ALT: 14 U/L (ref 14–54)
AST: 22 U/L (ref 15–41)
Albumin: 4.5 g/dL (ref 3.5–5.0)
Alkaline Phosphatase: 73 U/L (ref 38–126)
Anion gap: 9 (ref 5–15)
BUN: 14 mg/dL (ref 6–20)
CHLORIDE: 100 mmol/L — AB (ref 101–111)
CO2: 26 mmol/L (ref 22–32)
CREATININE: 0.61 mg/dL (ref 0.44–1.00)
Calcium: 9.5 mg/dL (ref 8.9–10.3)
Glucose, Bld: 118 mg/dL — ABNORMAL HIGH (ref 65–99)
Potassium: 3.2 mmol/L — ABNORMAL LOW (ref 3.5–5.1)
SODIUM: 135 mmol/L (ref 135–145)
Total Bilirubin: 0.9 mg/dL (ref 0.3–1.2)
Total Protein: 8.8 g/dL — ABNORMAL HIGH (ref 6.5–8.1)

## 2016-06-11 LAB — URINALYSIS, COMPLETE (UACMP) WITH MICROSCOPIC
BILIRUBIN URINE: NEGATIVE
Glucose, UA: NEGATIVE mg/dL
KETONES UR: 5 mg/dL — AB
LEUKOCYTES UA: NEGATIVE
NITRITE: NEGATIVE
PH: 5 (ref 5.0–8.0)
Protein, ur: 30 mg/dL — AB
Specific Gravity, Urine: 1.028 (ref 1.005–1.030)

## 2016-06-11 LAB — LIPASE, BLOOD: Lipase: 40 U/L (ref 11–51)

## 2016-06-11 LAB — CBC
HCT: 40.2 % (ref 35.0–47.0)
Hemoglobin: 13.2 g/dL (ref 12.0–16.0)
MCH: 26.3 pg (ref 26.0–34.0)
MCHC: 32.9 g/dL (ref 32.0–36.0)
MCV: 79.9 fL — AB (ref 80.0–100.0)
Platelets: 296 10*3/uL (ref 150–440)
RBC: 5.04 MIL/uL (ref 3.80–5.20)
RDW: 16.5 % — ABNORMAL HIGH (ref 11.5–14.5)
WBC: 8.4 10*3/uL (ref 3.6–11.0)

## 2016-06-11 LAB — PREGNANCY, URINE: Preg Test, Ur: NEGATIVE

## 2016-06-11 MED ORDER — METOCLOPRAMIDE HCL 5 MG/ML IJ SOLN
10.0000 mg | Freq: Once | INTRAMUSCULAR | Status: AC
  Administered 2016-06-11: 10 mg via INTRAVENOUS
  Filled 2016-06-11: qty 2

## 2016-06-11 MED ORDER — METOCLOPRAMIDE HCL 10 MG PO TABS: 10.0000 mg | ORAL_TABLET | Freq: Three times a day (TID) | ORAL | 0 refills | Status: DC | PRN

## 2016-06-11 MED ORDER — SODIUM CHLORIDE 0.9 % IV BOLUS (SEPSIS)
1000.0000 mL | Freq: Once | INTRAVENOUS | Status: AC
  Administered 2016-06-11: 1000 mL via INTRAVENOUS

## 2016-06-11 NOTE — ED Notes (Signed)
Patient denies being around anyone that she knows of sick. Does work with the public

## 2016-06-11 NOTE — ED Notes (Signed)
Pt unable to give urine sample at this time; pt has specimen cup. 

## 2016-06-11 NOTE — ED Triage Notes (Signed)
Nausea, vomiting and abdominal pain x 2 days.  

## 2016-06-11 NOTE — ED Provider Notes (Signed)
[facility] Emergency Department Provider Note  ____________________________________________   I have reviewed the triage vital signs and the nursing notes.   HISTORY  Chief Complaint Abdominal Pain and Emesis   History limited by: Not Limited   HPI Emily Parker is a 41 y.o. female who presents to the emergency department today because of concerns for nausea vomiting. Patient states she's had these symptoms for the past 2 days. The patient has had multiple episodes of emesis. She states it is nonbloody. She has tried some medication for but is not able to keep the medication down in addition she has had some associated diarrhea. She denies any abdominal pain. No fevers.   History reviewed. No pertinent past medical history.  There are no active problems to display for this patient.   Past Surgical History:  Procedure Laterality Date  . BACK SURGERY      Prior to Admission medications   Medication Sig Start Date End Date Taking? Authorizing Provider  cetirizine (ZYRTEC) 10 MG tablet Take 1 tablet (10 mg total) by mouth daily. 03/25/16   Delorise Royals Cuthriell, PA-C  clindamycin (CLEOCIN) 300 MG capsule Take 1 capsule (300 mg total) by mouth 3 (three) times daily. 03/26/16   Sharyn Creamer, MD  fluticasone (FLONASE) 50 MCG/ACT nasal spray Place 1 spray into both nostrils 2 (two) times daily. 03/25/16   Delorise Royals Cuthriell, PA-C  metoCLOPramide (REGLAN) 10 MG tablet Take 1 tablet (10 mg total) by mouth every 6 (six) hours as needed. 09/24/15   Myrna Blazer, MD  oxyCODONE-acetaminophen (ROXICET) 5-325 MG tablet Take 1 tablet by mouth every 6 (six) hours as needed for severe pain. 03/26/16   Sharyn Creamer, MD    Allergies Ibuprofen and Penicillins  No family history on file.  Social History Social History  Substance Use Topics  . Smoking status: Current Every Day Smoker    Packs/day: 0.25    Types: Cigarettes  . Smokeless tobacco: Never Used  .  Alcohol use Yes     Comment: occasional     Review of Systems  Constitutional: Negative for fever. Cardiovascular: Negative for chest pain. Respiratory: Negative for shortness of breath. Gastrointestinal: Negative for abdominal pain. Positive for vomiting, nausea and diarrhea. Neurological: Negative for headaches, focal weakness or numbness.  10-point ROS otherwise negative.  ____________________________________________   PHYSICAL EXAM:  VITAL SIGNS: ED Triage Vitals  Enc Vitals Group     BP 06/11/16 1744 (!) 153/101     Pulse Rate 06/11/16 1744 100     Resp 06/11/16 1744 18     Temp 06/11/16 1744 98.1 F (36.7 C)     Temp Source 06/11/16 1744 Oral     SpO2 06/11/16 1744 97 %     Weight 06/11/16 1744 200 lb (90.7 kg)     Height 06/11/16 1744 5\' 8"  (1.727 m)     Head Circumference --      Peak Flow --      Pain Score 06/11/16 1745 8   Constitutional: Alert and oriented. Well appearing and in no distress. Eyes: Conjunctivae are normal. Normal extraocular movements. ENT   Head: Normocephalic and atraumatic.   Nose: No congestion/rhinnorhea.   Mouth/Throat: Mucous membranes are moist.   Neck: No stridor. Hematological/Lymphatic/Immunilogical: No cervical lymphadenopathy. Cardiovascular: Normal rate, regular rhythm.  No murmurs, rubs, or gallops.  Respiratory: Normal respiratory effort without tachypnea nor retractions. Breath sounds are clear and equal bilaterally. No wheezes/rales/rhonchi. Gastrointestinal: Soft and non tender. No rebound.  No guarding.  Genitourinary: Deferred Musculoskeletal: Normal range of motion in all extremities. No lower extremity edema. Neurologic:  Normal speech and language. No gross focal neurologic deficits are appreciated.  Skin:  Skin is warm, dry and intact. No rash noted. Psychiatric: Mood and affect are normal. Speech and behavior are normal. Patient exhibits appropriate insight and  judgment.  ____________________________________________    LABS (pertinent positives/negatives)  Labs Reviewed  COMPREHENSIVE METABOLIC PANEL - Abnormal; Notable for the following:       Result Value   Potassium 3.2 (*)    Chloride 100 (*)    Glucose, Bld 118 (*)    Total Protein 8.8 (*)    All other components within normal limits  CBC - Abnormal; Notable for the following:    MCV 79.9 (*)    RDW 16.5 (*)    All other components within normal limits  URINALYSIS, COMPLETE (UACMP) WITH MICROSCOPIC - Abnormal; Notable for the following:    Color, Urine YELLOW (*)    APPearance CLEAR (*)    Hgb urine dipstick SMALL (*)    Ketones, ur 5 (*)    Protein, ur 30 (*)    Bacteria, UA RARE (*)    Squamous Epithelial / LPF 6-30 (*)    All other components within normal limits  LIPASE, BLOOD  PREGNANCY, URINE  POC URINE PREG, ED     ____________________________________________   EKG  None  ____________________________________________    RADIOLOGY  None  ____________________________________________   PROCEDURES  Procedures  ____________________________________________   INITIAL IMPRESSION / ASSESSMENT AND PLAN / ED COURSE  Pertinent labs & imaging results that were available during my care of the patient were reviewed by me and considered in my medical decision making (see chart for details).  Patient presented to the emergency department today with some nausea vomiting and a little bit of diarrhea. Patient's blood work without any concerning leukocytosis. Patient was negative for urine pregnancy. Abdominal exam was benign. Patient did feel much improvement after IV fluids and Reglan. She was able to tolerate by mouth. Given patient's clinical improvement this point not feel any emergent imaging is warranted. Doubt significant intra-abdominal infection. Will discharge home with Reglan.  ____________________________________________   FINAL CLINICAL IMPRESSION(S) /  ED DIAGNOSES  Final diagnoses:  Nausea and vomiting, intractability of vomiting not specified, unspecified vomiting type     Note: This dictation was prepared with Dragon dictation. Any transcriptional errors that result from this process are unintentional      Phineas SemenGraydon Yevonne Yokum, MD 06/11/16 2243

## 2016-06-11 NOTE — Discharge Instructions (Signed)
Please seek medical attention for any high fevers, chest pain, shortness of breath, change in behavior, persistent vomiting, bloody stool or any other new or concerning symptoms.  

## 2017-08-19 IMAGING — CT CT MAXILLOFACIAL W/ CM
3 of 5 series · 15 of 47 positions shown, 18 images · IV contrast (iopamidol)
Comparison: Head CT 07/08/2007

CLINICAL DATA: Right-sided facial pain and swelling over the last 2
days.

EXAM:
CT MAXILLOFACIAL WITH CONTRAST
TECHNIQUE: Multidetector CT imaging of the maxillofacial structures was
performed. Multiplanar CT image reconstructions were also generated.
A small metallic BB was placed on the right temple in order to
reliably differentiate right from left.
CONTRAST:  75mL BUPQ2H-VQQ IOPAMIDOL (BUPQ2H-VQQ) INJECTION 61%

[Series 2: max soft · axial · 0.37mm/px · z∈[-221,-75]mm · 10 of 87 slices shown, 13 images]
[im 7/87  brain]
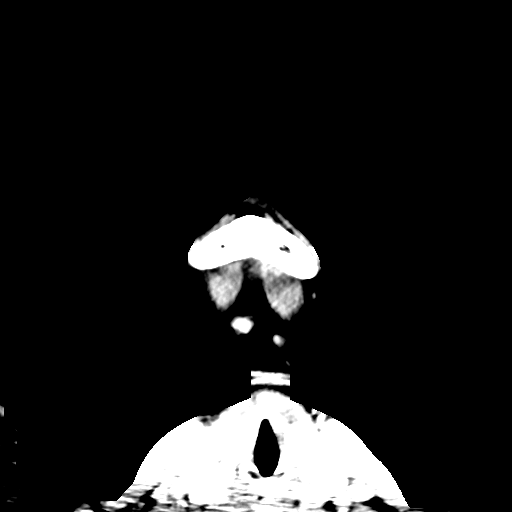
[im 7/87  bone]
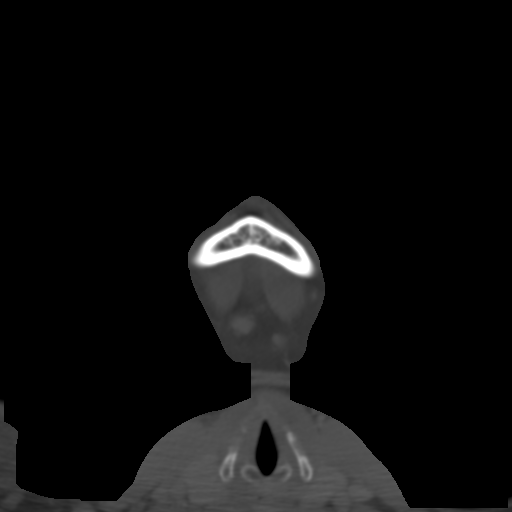
[im 14/87  bone]
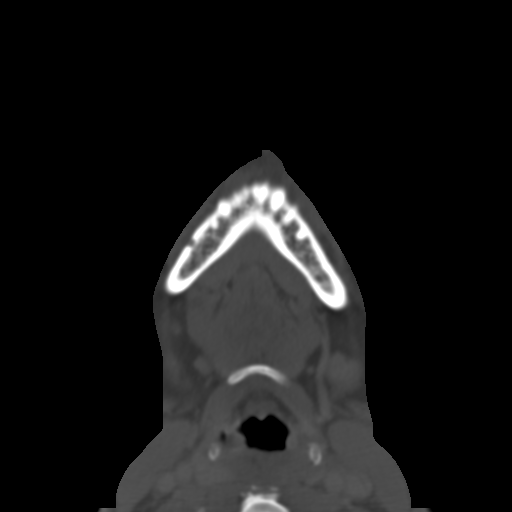
[im 25/87  bone]
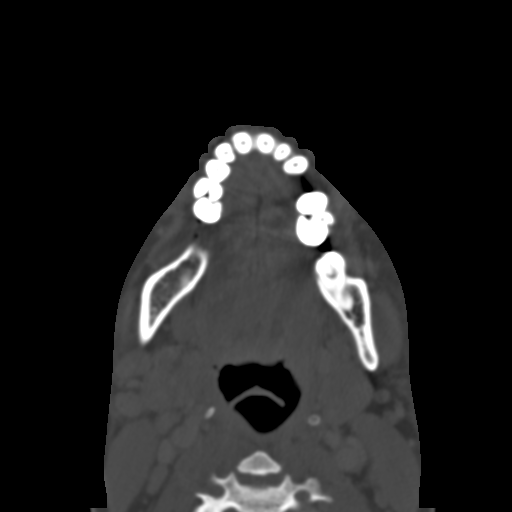
[im 31/87  bone]
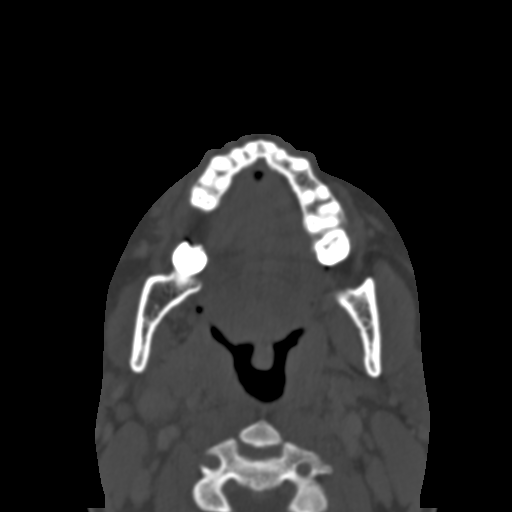
[im 38/87  brain]
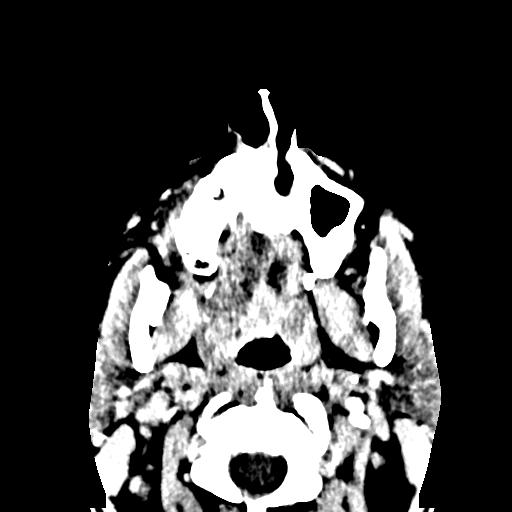
[im 38/87  bone]
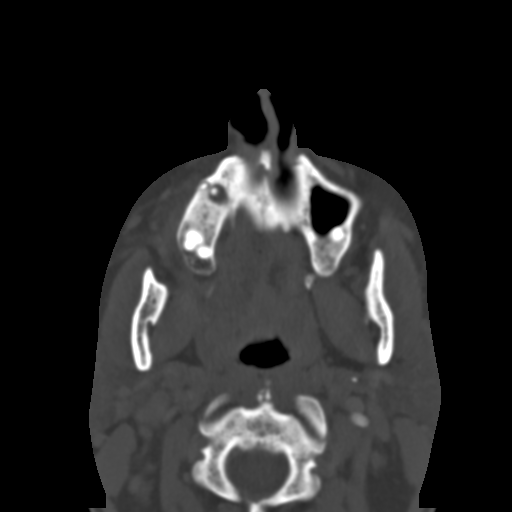
[im 49/87  bone]
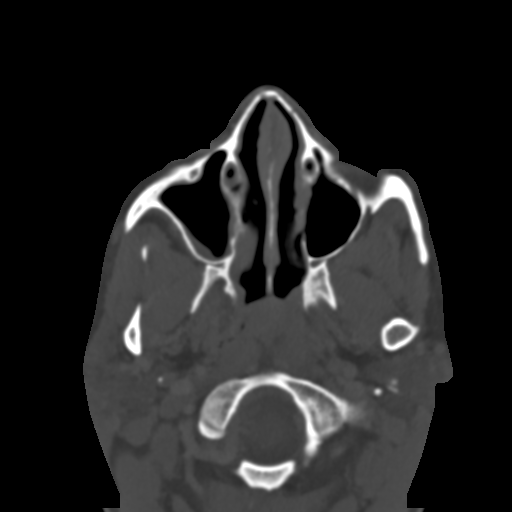
[im 56/87  bone]
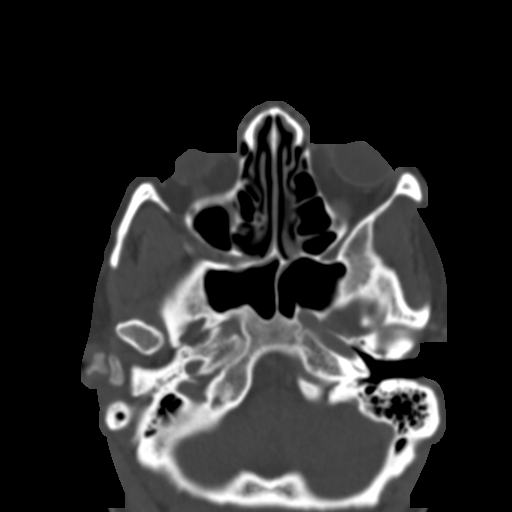
[im 66/87  bone]
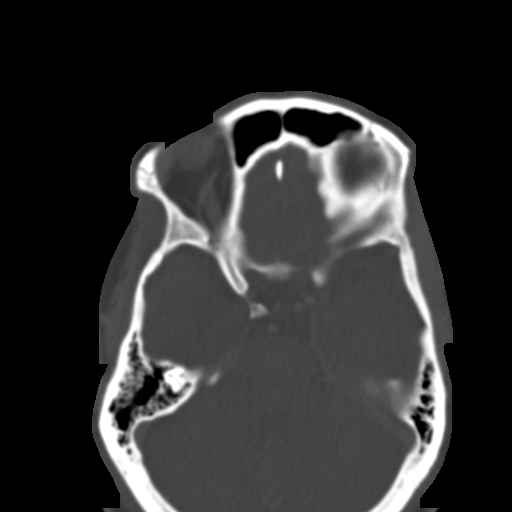
[im 73/87  brain]
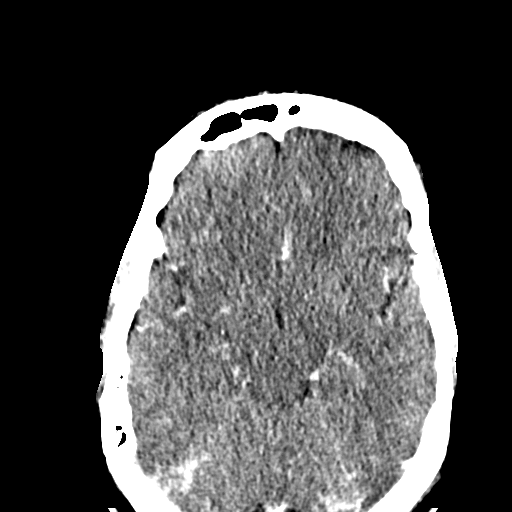
[im 73/87  bone]
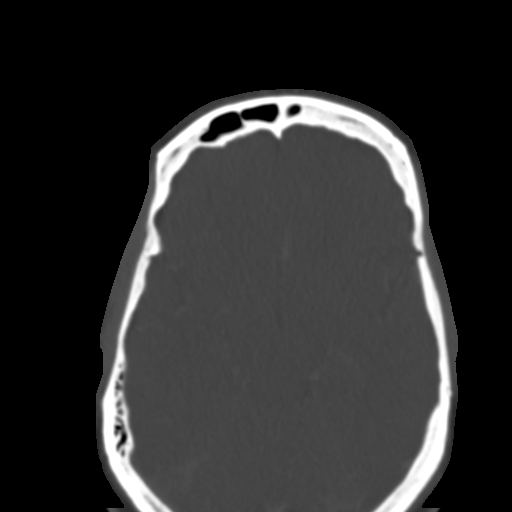
[im 80/87  bone]
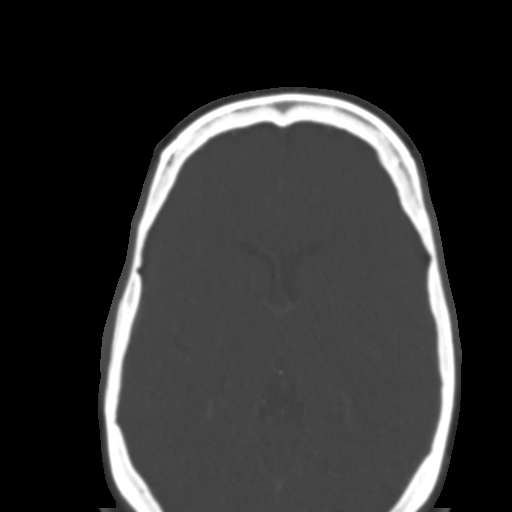

[Series 6: coronal soft · coronal · 0.33mm/px · 3 of 82 slices shown]
[im 28/82  bone]
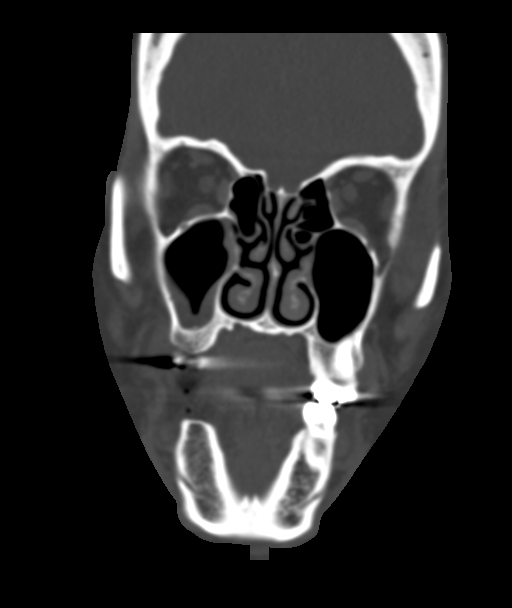
[im 37/82  bone]
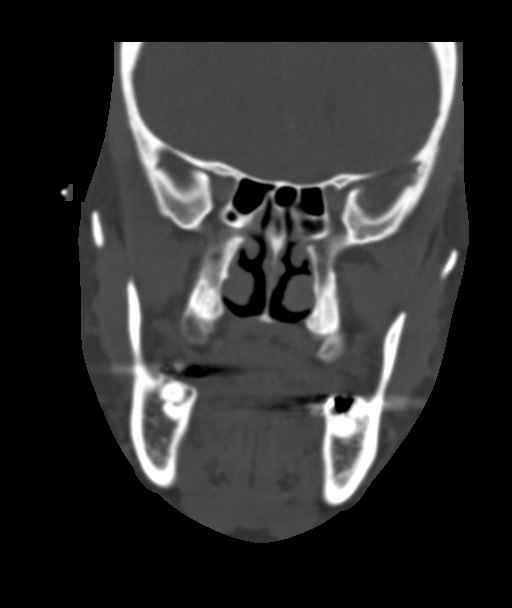
[im 46/82  bone]
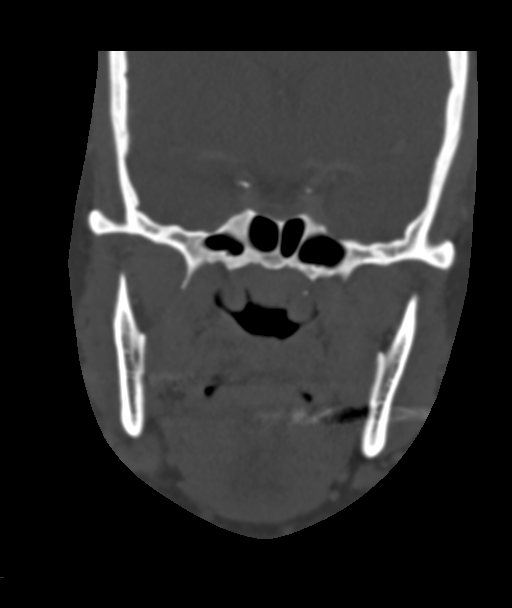

[Series 9: sagittal bone · sagittal · 0.37mm/px · 2 of 75 slices shown]
[im 34/75  bone]
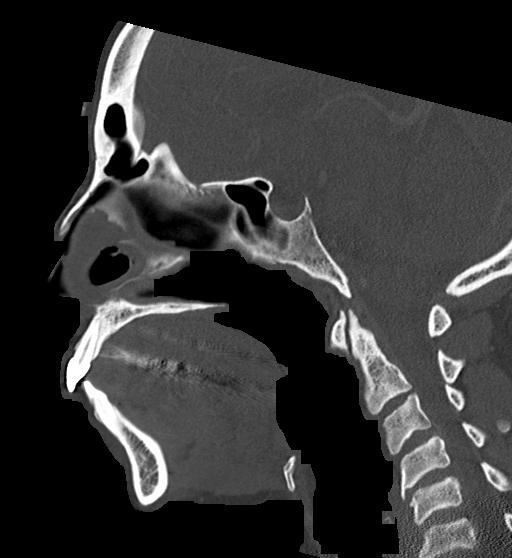
[im 67/75  bone]
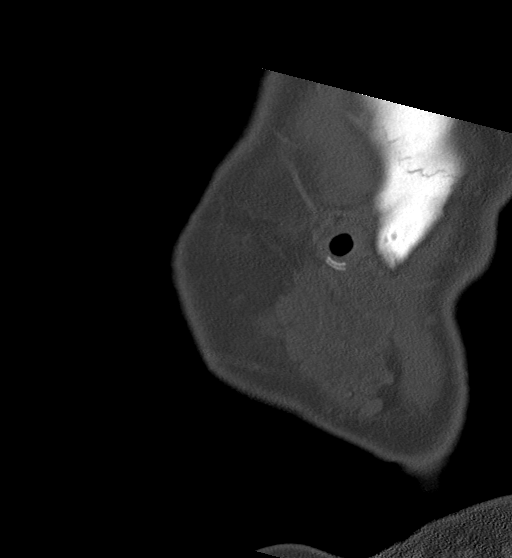

[15 of 47 positions shown; findings below may reference images not displayed]

FINDINGS: Osseous: No abnormal primary bone finding. There is dental and
periodontal disease. There is lucency around the roots of tooth
number 4.

Orbits: Normal

Sinuses: Mucosal thickening along the floor of the right maxillary
sinus, probably secondary to the dental root disease.

Soft tissues: No facial soft tissue abnormality is seen. No evidence
of facial cellulitis. Parotid and submandibular glands are normal.
Slight prominence of level 1 and 2 lymph nodes on the right adjacent
to the submandibular gland which could be reactive to mild
inflammation.

Limited intracranial: Negative
IMPRESSION: No evidence of facial cellulitis. Slight prominence of the level 1
and level 2 lymph nodes on the right which could be reactive to a
mild inflammatory process.

Lucency around the roots of tooth number 4 which could be
symptomatic. Mucosal thickening along the floor of the right
maxillary sinus, probably secondary to this dental root disease.

No evidence of orbital pathology.

## 2019-12-12 ENCOUNTER — Other Ambulatory Visit: Payer: Self-pay

## 2019-12-12 ENCOUNTER — Ambulatory Visit: Payer: Self-pay | Admitting: Family Medicine

## 2019-12-12 VITALS — BP 132/86 | HR 91 | Wt 260.0 lb

## 2019-12-12 DIAGNOSIS — Z0100 Encounter for examination of eyes and vision without abnormal findings: Secondary | ICD-10-CM

## 2019-12-12 DIAGNOSIS — L209 Atopic dermatitis, unspecified: Secondary | ICD-10-CM

## 2019-12-12 DIAGNOSIS — L309 Dermatitis, unspecified: Secondary | ICD-10-CM

## 2019-12-12 MED ORDER — CLOTRIMAZOLE-BETAMETHASONE 1-0.05 % EX CREA: 1.0000 "application " | TOPICAL_CREAM | Freq: Two times a day (BID) | CUTANEOUS | 1 refills | Status: AC

## 2019-12-12 MED ORDER — CLOTRIMAZOLE-BETAMETHASONE 1-0.05 % EX CREA: 1.0000 "application " | TOPICAL_CREAM | Freq: Two times a day (BID) | CUTANEOUS | 0 refills | Status: DC

## 2019-12-12 MED ORDER — TRIAMCINOLONE ACETONIDE 0.5 % EX OINT: 1.0000 "application " | TOPICAL_OINTMENT | Freq: Two times a day (BID) | CUTANEOUS | 0 refills | Status: AC

## 2019-12-12 NOTE — Patient Instructions (Signed)
Atopic Dermatitis Atopic dermatitis is a skin disorder that causes inflammation of the skin. This is the most common type of eczema. Eczema is a group of skin conditions that cause the skin to be itchy, red, and swollen. This condition is generally worse during the cooler winter months and often improves during the warm summer months. Symptoms can vary from person to person. Atopic dermatitis usually starts showing signs in infancy and can last through adulthood. This condition cannot be passed from one person to another (non-contagious), but it is more common in families. Atopic dermatitis may not always be present. When it is present, it is called a flare-up. What are the causes? The exact cause of this condition is not known. Flare-ups of the condition may be triggered by:  Contact with something that you are sensitive or allergic to.  Stress.  Certain foods.  Extremely hot or cold weather.  Harsh chemicals and soaps.  Dry air.  Chlorine. What increases the risk? This condition is more likely to develop in people who have a personal history or family history of eczema, allergies, asthma, or hay fever. What are the signs or symptoms? Symptoms of this condition include:  Dry, scaly skin.  Red, itchy rash.  Itchiness, which can be severe. This may occur before the skin rash. This can make sleeping difficult.  Skin thickening and cracking that can occur over time. How is this diagnosed? This condition is diagnosed based on your symptoms, a medical history, and a physical exam. How is this treated? There is no cure for this condition, but symptoms can usually be controlled. Treatment focuses on:  Controlling the itchiness and scratching. You may be given medicines, such as antihistamines or steroid creams.  Limiting exposure to things that you are sensitive or allergic to (allergens).  Recognizing situations that cause stress and developing a plan to manage stress. If your  atopic dermatitis does not get better with medicines, or if it is all over your body (widespread), a treatment using a specific type of light (phototherapy) may be used. Follow these instructions at home: Skin care   Keep your skin well-moisturized. Doing this seals in moisture and helps to prevent dryness. ? Use unscented lotions that have petroleum in them. ? Avoid lotions that contain alcohol or water. They can dry the skin.  Keep baths or showers short (less than 5 minutes) in warm water. Do not use hot water. ? Use mild, unscented cleansers for bathing. Avoid soap and bubble bath. ? Apply a moisturizer to your skin right after a bath or shower.  Do not apply anything to your skin without checking with your health care provider. General instructions  Dress in clothes made of cotton or cotton blends. Dress lightly because heat increases itchiness.  When washing your clothes, rinse your clothes twice so all of the soap is removed.  Avoid any triggers that can cause a flare-up.  Try to manage your stress.  Keep your fingernails cut short.  Avoid scratching. Scratching makes the rash and itchiness worse. It may also result in a skin infection (impetigo) due to a break in the skin caused by scratching.  Take or apply over-the-counter and prescription medicines only as told by your health care provider.  Keep all follow-up visits as told by your health care provider. This is important.  Do not be around people who have cold sores or fever blisters. If you get the infection, it may cause your atopic dermatitis to worsen. Contact a health   care provider if:  Your itchiness interferes with sleep.  Your rash gets worse or it is not better within one week of starting treatment.  You have a fever.  You have a rash flare-up after having contact with someone who has cold sores or fever blisters. Get help right away if:  You develop pus or soft yellow scabs in the rash  area. Summary  This condition causes a red rash and itchy, dry, scaly skin.  Treatment focuses on controlling the itchiness and scratching, limiting exposure to things that you are sensitive or allergic to (allergens), recognizing situations that cause stress, and developing a plan to manage stress.  Keep your skin well-moisturized.  Keep baths or showers shorter than 5 minutes and use warm water. Do not use hot water. This information is not intended to replace advice given to you by your health care provider. Make sure you discuss any questions you have with your health care provider. Document Revised: 07/03/2018 Document Reviewed: 04/15/2016 Elsevier Patient Education  2020 [employer] Contact Dermatitis Dermatitis is redness, soreness, and swelling (inflammation) of the skin. Contact dermatitis is a reaction to something that touches the skin. There are two types of contact dermatitis:  Irritant contact dermatitis. This happens when something bothers (irritates) your skin, like soap.  Allergic contact dermatitis. This is caused when you are exposed to something that you are allergic to, such as poison ivy. What are the causes?  Common causes of irritant contact dermatitis include: ? Makeup. ? Soaps. ? Detergents. ? Bleaches. ? Acids. ? Metals, such as nickel.  Common causes of allergic contact dermatitis include: ? Plants. ? Chemicals. ? Jewelry. ? Latex. ? Medicines. ? Preservatives in products, such as clothing. What increases the risk?  Having a job that exposes you to things that bother your skin.  Having asthma or eczema. What are the signs or symptoms? Symptoms may happen anywhere the irritant has touched your skin. Symptoms include:  Dry or flaky skin.  Redness.  Cracks.  Itching.  Pain or a burning feeling.  Blisters.  Blood or clear fluid draining from skin cracks. With allergic contact dermatitis, swelling may occur. This may happen in places  such as the eyelids, mouth, or genitals. How is this treated?  This condition is treated by checking for the cause of the reaction and protecting your skin. Treatment may also include: ? Steroid creams, ointments, or medicines. ? Antibiotic medicines or other ointments, if you have a skin infection. ? Lotion or medicines to help with itching. ? A bandage (dressing). Follow these instructions at home: Skin care  Moisturize your skin as needed.  Put cool cloths on your skin.  Put a baking soda paste on your skin. Stir water into baking soda until it looks like a paste.  Do not scratch your skin.  Avoid having things rub up against your skin.  Avoid the use of soaps, perfumes, and dyes. Medicines  Take or apply over-the-counter and prescription medicines only as told by your doctor.  If you were prescribed an antibiotic medicine, take or apply it as told by your doctor. Do not stop using it even if your condition starts to get better. Bathing  Take a bath with: ? Epsom salts. ? Baking soda. ? Colloidal oatmeal.  Bathe less often.  Bathe in warm water. Avoid using hot water. Bandage care  If you were given a bandage, change it as told by your health care provider.  Wash your hands with soap and  water before and after you change your bandage. If soap and water are not available, use hand sanitizer. General instructions  Avoid the things that caused your reaction. If you do not know what caused it, keep a journal. Write down: ? What you eat. ? What skin products you use. ? What you drink. ? What you wear in the area that has symptoms. This includes jewelry.  Check the affected areas every day for signs of infection. Check for: ? More redness, swelling, or pain. ? More fluid or blood. ? Warmth. ? Pus or a bad smell.  Keep all follow-up visits as told by your doctor. This is important. Contact a doctor if:  You do not get better with treatment.  Your condition  gets worse.  You have signs of infection, such as: ? More swelling. ? Tenderness. ? More redness. ? Soreness. ? Warmth.  You have a fever.  You have new symptoms. Get help right away if:  You have a very bad headache.  You have neck pain.  Your neck is stiff.  You throw up (vomit).  You feel very sleepy.  You see red streaks coming from the area.  Your bone or joint near the area hurts after the skin has healed.  The area turns darker.  You have trouble breathing. Summary  Dermatitis is redness, soreness, and swelling of the skin.  Symptoms may occur where the irritant has touched you.  Treatment may include medicines and skin care.  If you do not know what caused your reaction, keep a journal.  Contact a doctor if your condition gets worse or you have signs of infection. This information is not intended to replace advice given to you by your health care provider. Make sure you discuss any questions you have with your health care provider. Document Revised: 07/04/2018 Document Reviewed: 09/27/2017 Elsevier Patient Education  2020 [employer].

## 2019-12-12 NOTE — Progress Notes (Signed)
[facility]   Progress Note: General Provider: Mike Gip, FNP  SUBJECTIVE:   Emily Parker is a 44 y.o. female who  has no past medical history on file.. Patient presents today for Rash (4-5 months of hyperpigmented scaly rash on bilateral palms and soles. Has tried OTC antifungals and antibiotic cream with no change. Itchy and painful. No other family member with rash )  Patient is here to establish care with this clinic.   Rash to palmer and plantar surfaces of bilateral hands and feet x 1 month. She denies use of any new lotions, cleaners, or chemicals. She states that she noticed it after going to the nail salon. Has been putting antibiotic ointment to the areas without relief. Patient states that the rash cleared when she went to the beach and returned once she came home. Denies CP, SOB, Dizziness, hearing loss or neurological symptoms.   Review of Systems  Constitutional: Negative.   HENT: Negative.   Eyes: Negative.   Respiratory: Negative.   Cardiovascular: Negative.   Gastrointestinal: Negative.   Genitourinary: Negative.   Musculoskeletal: Negative.   Skin: Positive for itching and rash.  Neurological: Negative.   Psychiatric/Behavioral: Negative.      OBJECTIVE: BP 132/86 (BP Location: Left Arm, Patient Position: Sitting, Cuff Size: Large)   Pulse 91   Wt 260 lb (117.9 kg)   SpO2 97%   BMI 39.53 kg/m   Wt Readings from Last 3 Encounters:  12/12/19 260 lb (117.9 kg)  06/11/16 200 lb (90.7 kg)  03/26/16 200 lb (90.7 kg)     Physical Exam Vitals reviewed.  Constitutional:      General: She is not in acute distress.    Appearance: Normal appearance. She is not ill-appearing.  HENT:     Head: Normocephalic and atraumatic.     Mouth/Throat:     Mouth: Mucous membranes are moist.     Pharynx: No oropharyngeal exudate or posterior oropharyngeal erythema.  Eyes:     Extraocular Movements: Extraocular movements intact.     Pupils: Pupils  are equal, round, and reactive to light.  Cardiovascular:     Rate and Rhythm: Normal rate and regular rhythm.     Pulses: Normal pulses.     Heart sounds: Normal heart sounds.  Pulmonary:     Effort: Pulmonary effort is normal.     Breath sounds: Normal breath sounds. No wheezing, rhonchi or rales.  Musculoskeletal:     Cervical back: Normal range of motion.  Neurological:     Mental Status: She is alert and oriented to person, place, and time.  Psychiatric:        Mood and Affect: Mood normal.        Behavior: Behavior normal.        Thought Content: Thought content normal.        Judgment: Judgment normal.           ASSESSMENT/PLAN:  1. Atopic dermatitis, unspecified type - RPR - triamcinolone ointment (KENALOG) 0.5 %; Apply 1 application topically 2 (two) times daily.  Dispense: 60 g; Refill: 0 - clotrimazole-betamethasone (LOTRISONE) cream; Apply 1 application topically 2 (two) times daily.  Dispense: 45 g; Refill: 1  2. Dermatitis - RPR - Comprehensive metabolic panel - HIV Antibody (routine testing w rflx); Future - Ambulatory referral to Dermatology - CBC - HIV Antibody (routine testing w rflx)  3. Eye exam, routine - Ambulatory referral to Ophthalmology    Return in about 1  week (around 12/19/2019) for dermatitis.    The patient was given clear instructions to go to ER or return to medical center if symptoms do not improve, worsen or new problems develop. The patient verbalized understanding and agreed with plan of care.   Ms. Freda Jackson. Riley Lam, FNP-BC [facility]

## 2019-12-13 LAB — COMPREHENSIVE METABOLIC PANEL
ALT: 14 IU/L (ref 0–32)
AST: 14 IU/L (ref 0–40)
Albumin/Globulin Ratio: 1.3 (ref 1.2–2.2)
Albumin: 4.2 g/dL (ref 3.8–4.8)
Alkaline Phosphatase: 99 IU/L (ref 44–121)
BUN/Creatinine Ratio: 10 (ref 9–23)
BUN: 8 mg/dL (ref 6–24)
Bilirubin Total: 0.3 mg/dL (ref 0.0–1.2)
CO2: 23 mmol/L (ref 20–29)
Calcium: 9 mg/dL (ref 8.7–10.2)
Chloride: 104 mmol/L (ref 96–106)
Creatinine, Ser: 0.77 mg/dL (ref 0.57–1.00)
GFR calc Af Amer: 109 mL/min/{1.73_m2} (ref >59)
GFR calc non Af Amer: 95 mL/min/{1.73_m2} (ref >59)
Globulin, Total: 3.3 g/dL (ref 1.5–4.5)
Glucose: 98 mg/dL (ref 65–99)
Potassium: 4 mmol/L (ref 3.5–5.2)
Sodium: 138 mmol/L (ref 134–144)
Total Protein: 7.5 g/dL (ref 6.0–8.5)

## 2019-12-13 LAB — CBC
Hematocrit: 36.7 % (ref 34.0–46.6)
Hemoglobin: 11.4 g/dL (ref 11.1–15.9)
MCH: 23.5 pg — ABNORMAL LOW (ref 26.6–33.0)
MCHC: 31.1 g/dL — ABNORMAL LOW (ref 31.5–35.7)
MCV: 76 fL — ABNORMAL LOW (ref 79–97)
Platelets: 333 10*3/uL (ref 150–450)
RBC: 4.85 x10E6/uL (ref 3.77–5.28)
RDW: 16.2 % — ABNORMAL HIGH (ref 11.7–15.4)
WBC: 5.8 10*3/uL (ref 3.4–10.8)

## 2019-12-13 LAB — HIV ANTIBODY (ROUTINE TESTING W REFLEX): HIV Screen 4th Generation wRfx: NONREACTIVE

## 2019-12-13 LAB — RPR: RPR Ser Ql: NONREACTIVE

## 2019-12-19 ENCOUNTER — Ambulatory Visit: Payer: Self-pay | Admitting: Family Medicine

## 2019-12-19 ENCOUNTER — Other Ambulatory Visit: Payer: Self-pay

## 2019-12-19 VITALS — BP 119/82 | HR 104 | Ht 66.0 in | Wt 261.2 lb

## 2019-12-19 DIAGNOSIS — L209 Atopic dermatitis, unspecified: Secondary | ICD-10-CM

## 2019-12-19 NOTE — Patient Instructions (Addendum)
Anemia  Anemia is a condition in which you do not have enough red blood cells or hemoglobin. Hemoglobin is a substance in red blood cells that carries oxygen. When you do not have enough red blood cells or hemoglobin (are anemic), your body cannot get enough oxygen and your organs may not work properly. As a result, you may feel very tired or have other problems. What are the causes? Common causes of anemia include:  Excessive bleeding. Anemia can be caused by excessive bleeding inside or outside the body, including bleeding from the intestine or from periods in women.  Poor nutrition.  Long-lasting (chronic) kidney, thyroid, and liver disease.  Bone marrow disorders.  Cancer and treatments for cancer.  HIV (human immunodeficiency virus) and AIDS (acquired immunodeficiency syndrome).  Treatments for HIV and AIDS.  Spleen problems.  Blood disorders.  Infections, medicines, and autoimmune disorders that destroy red blood cells. What are the signs or symptoms? Symptoms of this condition include:  Minor weakness.  Dizziness.  Headache.  Feeling heartbeats that are irregular or faster than normal (palpitations).  Shortness of breath, especially with exercise.  Paleness.  Cold sensitivity.  Indigestion.  Nausea.  Difficulty sleeping.  Difficulty concentrating. Symptoms may occur suddenly or develop slowly. If your anemia is mild, you may not have symptoms. How is this diagnosed? This condition is diagnosed based on:  Blood tests.  Your medical history.  A physical exam.  Bone marrow biopsy. Your health care provider may also check your stool (feces) for blood and may do additional testing to look for the cause of your bleeding. You may also have other tests, including:  Imaging tests, such as a CT scan or MRI.  Endoscopy.  Colonoscopy. How is this treated? Treatment for this condition depends on the cause. If you continue to lose a lot of blood, you may  need to be treated at a hospital. Treatment may include:  Taking supplements of iron, vitamin S31, or folic acid.  Taking a hormone medicine (erythropoietin) that can help to stimulate red blood cell growth.  Having a blood transfusion. This may be needed if you lose a lot of blood.  Making changes to your diet.  Having surgery to remove your spleen. Follow these instructions at home:  Take over-the-counter and prescription medicines only as told by your health care provider.  Take supplements only as told by your health care provider.  Follow any diet instructions that you were given.  Keep all follow-up visits as told by your health care provider. This is important. Contact a health care provider if:  You develop new bleeding anywhere in the body. Get help right away if:  You are very weak.  You are short of breath.  You have pain in your abdomen or chest.  You are dizzy or feel faint.  You have trouble concentrating.  You have bloody or black, tarry stools.  You vomit repeatedly or you vomit up blood. Summary  Anemia is a condition in which you do not have enough red blood cells or enough of a substance in your red blood cells that carries oxygen (hemoglobin).  Symptoms may occur suddenly or develop slowly.  If your anemia is mild, you may not have symptoms.  This condition is diagnosed with blood tests as well as a medical history and physical exam. Other tests may be needed.  Treatment for this condition depends on the cause of the anemia. This information is not intended to replace advice given to you by  your health care provider. Make sure you discuss any questions you have with your health care provider. Document Revised: 02/24/2017 Document Reviewed: 04/15/2016 Elsevier Patient Education  [place]. Atopic Dermatitis Atopic dermatitis is a skin disorder that causes inflammation of the skin. This is the most common type of eczema. Eczema is a  group of skin conditions that cause the skin to be itchy, red, and swollen. This condition is generally worse during the cooler winter months and often improves during the warm summer months. Symptoms can vary from person to person. Atopic dermatitis usually starts showing signs in infancy and can last through adulthood. This condition cannot be passed from one person to another (non-contagious), but it is more common in families. Atopic dermatitis may not always be present. When it is present, it is called a flare-up. What are the causes? The exact cause of this condition is not known. Flare-ups of the condition may be triggered by:  Contact with something that you are sensitive or allergic to.  Stress.  Certain foods.  Extremely hot or cold weather.  Harsh chemicals and soaps.  Dry air.  Chlorine. What increases the risk? This condition is more likely to develop in people who have a personal history or family history of eczema, allergies, asthma, or hay fever. What are the signs or symptoms? Symptoms of this condition include:  Dry, scaly skin.  Red, itchy rash.  Itchiness, which can be severe. This may occur before the skin rash. This can make sleeping difficult.  Skin thickening and cracking that can occur over time. How is this diagnosed? This condition is diagnosed based on your symptoms, a medical history, and a physical exam. How is this treated? There is no cure for this condition, but symptoms can usually be controlled. Treatment focuses on:  Controlling the itchiness and scratching. You may be given medicines, such as antihistamines or steroid creams.  Limiting exposure to things that you are sensitive or allergic to (allergens).  Recognizing situations that cause stress and developing a plan to manage stress. If your atopic dermatitis does not get better with medicines, or if it is all over your body (widespread), a treatment using a specific type of light  (phototherapy) may be used. Follow these instructions at home: Skin care   Keep your skin well-moisturized. Doing this seals in moisture and helps to prevent dryness. ? Use unscented lotions that have petroleum in them. ? Avoid lotions that contain alcohol or water. They can dry the skin.  Keep baths or showers short (less than 5 minutes) in warm water. Do not use hot water. ? Use mild, unscented cleansers for bathing. Avoid soap and bubble bath. ? Apply a moisturizer to your skin right after a bath or shower.  Do not apply anything to your skin without checking with your health care provider. General instructions  Dress in clothes made of cotton or cotton blends. Dress lightly because heat increases itchiness.  When washing your clothes, rinse your clothes twice so all of the soap is removed.  Avoid any triggers that can cause a flare-up.  Try to manage your stress.  Keep your fingernails cut short.  Avoid scratching. Scratching makes the rash and itchiness worse. It may also result in a skin infection (impetigo) due to a break in the skin caused by scratching.  Take or apply over-the-counter and prescription medicines only as told by your health care provider.  Keep all follow-up visits as told by your health care provider. This  is important.  Do not be around people who have cold sores or fever blisters. If you get the infection, it may cause your atopic dermatitis to worsen. Contact a health care provider if:  Your itchiness interferes with sleep.  Your rash gets worse or it is not better within one week of starting treatment.  You have a fever.  You have a rash flare-up after having contact with someone who has cold sores or fever blisters. Get help right away if:  You develop pus or soft yellow scabs in the rash area. Summary  This condition causes a red rash and itchy, dry, scaly skin.  Treatment focuses on controlling the itchiness and scratching, limiting  exposure to things that you are sensitive or allergic to (allergens), recognizing situations that cause stress, and developing a plan to manage stress.  Keep your skin well-moisturized.  Keep baths or showers shorter than 5 minutes and use warm water. Do not use hot water. This information is not intended to replace advice given to you by your health care provider. Make sure you discuss any questions you have with your health care provider. Document Revised: 07/03/2018 Document Reviewed: 04/15/2016 Elsevier Patient Education  [place].

## 2019-12-19 NOTE — Progress Notes (Signed)
  [facility]   Progress Note: General Provider: Mike Gip, FNP  SUBJECTIVE:   Emily Parker is a 44 y.o. female who  has no past medical history on file.. Patient presents today for Results (results from bloodwork)  Patient here to review labs. HIV RPR negative. Mild anemia shown. No other problems or concerns. She has an appt with derm for follow up on atopic derm. Eye appointment also scheduled  Review of Systems  Skin: Positive for rash.  All other systems reviewed and are negative.    OBJECTIVE: BP 119/82 (BP Location: Right Arm, Patient Position: Sitting, Cuff Size: Large)   Pulse (!) 104   Ht 5\' 6"  (1.676 m)   Wt 261 lb 3.2 oz (118.5 kg)   LMP 12/03/2019 (Exact Date)   SpO2 96%   BMI 42.16 kg/m   Wt Readings from Last 3 Encounters:  12/19/19 261 lb 3.2 oz (118.5 kg)  12/12/19 260 lb (117.9 kg)  06/11/16 200 lb (90.7 kg)     Physical Exam Constitutional:      Appearance: Normal appearance.  HENT:     Head: Normocephalic and atraumatic.  Skin:    Findings: Rash (bilateral palms and plantar surfaces. Mild improvement. ) present.  Neurological:     Mental Status: She is alert and oriented to person, place, and time.  Psychiatric:        Mood and Affect: Mood normal.        Behavior: Behavior normal.        Thought Content: Thought content normal.        Judgment: Judgment normal.     ASSESSMENT/PLAN:   1. Atopic dermatitis, unspecified type Reviewed labs. Folllow up with derm.    Return in about 3 months (around 03/19/2020) for Lab work anemia atopic derm.    The patient was given clear instructions to go to ER or return to medical center if symptoms do not improve, worsen or new problems develop. The patient verbalized understanding and agreed with plan of care.   Ms. 03/21/2020. Freda Jackson, FNP-BC [facility]

## 2019-12-23 ENCOUNTER — Ambulatory Visit (INDEPENDENT_AMBULATORY_CARE_PROVIDER_SITE_OTHER): Payer: Self-pay | Admitting: Dermatology

## 2019-12-23 ENCOUNTER — Other Ambulatory Visit: Payer: Self-pay

## 2019-12-23 ENCOUNTER — Encounter: Payer: Self-pay | Admitting: Dermatology

## 2019-12-23 DIAGNOSIS — R21 Rash and other nonspecific skin eruption: Secondary | ICD-10-CM

## 2019-12-23 DIAGNOSIS — L409 Psoriasis, unspecified: Secondary | ICD-10-CM

## 2019-12-23 MED ORDER — TRIAMCINOLONE ACETONIDE 0.1 % EX OINT: TOPICAL_OINTMENT | CUTANEOUS | 2 refills | Status: AC

## 2019-12-23 NOTE — Progress Notes (Signed)
   New Patient Visit  Subjective  Emily Parker is a 44 y.o. female who presents for the following: Rash (on the hands and feet x 3 months - itches, burns, stings. Patient is concerned it may be a fungal infection and is currently using Clotrimazole/Betamethasone cream. She thinks she has noticed some improvement since starting the cream).  The following portions of the chart were reviewed this encounter and updated as appropriate:  Tobacco  Allergies  Meds  Problems  Med Hx  Surg Hx  Fam Hx     Review of Systems:  No other skin or systemic complaints except as noted in HPI or Assessment and Plan.  Objective  Well appearing patient in no apparent distress; mood and affect are within normal limits.  A focused examination was performed including the hands, elbows, and feet. Relevant physical exam findings are noted in the Assessment and Plan.  Objective  The feet, elbows, hands: Well-demarcated erythematous papules/plaques with silvery scale. Thickening of the skin on the elbows. Scalp and ears are clear. Knees are clear.  Images            Assessment & Plan  Rash -psoriasis, severe affecting activities of daily living involving the hands and feet The feet, elbows, hands Psoriasis - discussed condition in detail with patient; hand out given  Start TMC 0.1% ointment apply to aa's BID x 2 weeks. Then decrease to BID 5d/wk. Avoid f/g/a. Narrowband ultraviolet B light treatment and XTRAC laser treatment.  May add topical vitamin D analog in the future. If not improving with topical treatment consider oral systemic Otezla treatment and may consider stomach biologic shots in the future.  Topical steroids (such as triamcinolone, fluocinolone, fluocinonide, mometasone, clobetasol, halobetasol, betamethasone, hydrocortisone) can cause thinning and lightening of the skin if they are used for too long in the same area. Your physician has selected the right strength medicine for  your problem and area affected on the body. Please use your medication only as directed by your physician to prevent side effects.   triamcinolone ointment (KENALOG) 0.1 % - The feet, elbows, hands  Return in about 6 weeks (around 02/03/2020).  Maylene Roes, CMA, am acting as scribe for Armida Sans, MD .  Documentation: I have reviewed the above documentation for accuracy and completeness, and I agree with the above.  Armida Sans, MD

## 2019-12-23 NOTE — Patient Instructions (Addendum)
Psoriasis  What causes psoriasis? A patient's immune system plays a role in the development of psoriasis through the over-activity of a type of white blood cell called a T cell.  Once these cells are activated, a reaction is triggered that causes the skin to grow faster than normal.  New skin cells form in days instead of weeks, and these cells do not shed.  These cells pile up on the skin, and this results in what you see as psoriasis.  It is not contagious.  There is a genetic component to psoriasis, although not everyone inherits the gene.  What triggers psoriasis? Common triggers are stress, strep throat infection (more commonly in kids), and cold weather.  During stressful events, psoriasis tends to flare.  Certain medications such as lithium, some blood pressure medications, and some drugs used to treat malaria can trigger psoriasis.  A skin injury can also trigger psoriasis to develop at the site of injury.  Types of Psoriasis 1. Plaque psoriasis:  80% of patients with psoriasis have plaque psoriasis.  Plaques usually form on elbows, knees, and lower back and appear raised and reddish with a silvery white scale. 2. Nail psoriasis:  Fingernails and toenails may be affected.  Initially small pits may form, then with time, the nails thicken, lift up, and crumble.   3. Scalp psoriasis:  Similar in appearance to plaque psoriasis on the body.  Tends to be itchy.  Clinically can resemble dandruff because of the scales that can fall on a patient's shirt.  This may be difficult to control. 4. Pustular psoriasis:  Usually involves the palms and soles appearing as white, pus-filled bumps.  Rarely, it may develop all over the body, which can make patients seriously ill. 5. Guttate psoriasis:  Usually occurs in children and young adults.  The lesions are smaller than plaque psoriasis.  May be triggered by a strep throat infection.  Many times it clears on its own, and the patient may or may not develop  psoriasis again.   6. Inverse psoriasis:  Involves the folds of the skin and may be painful.  This appears as red, shiny patches.  It may involve the armpits, under the breasts, the genital area, or the crease of the buttock.  7. Psoriatic arthritis:  Up to one third of patients with psoriasis will develop arthritis.  It commonly affects the hands, feet, and spine, and may begin as stiffness.  If untreated it may lead to permanent joint damage.  Treatment There is no cure for psoriasis, but it can be controlled.  Some patients undergo more than one type of treatment.  1. Topical medications (applied externally to the skin) a. Corticosteroids:  typically first-line treatment for psoriasis.  They control the inflammation of psoriasis.  They are available as creams, ointments, sprays, foams, or lotions.  It is important to follow your dermatologist's instructions as to how to apply the medicine.  For example, excessive use of strong steroid creams (especially to body creases and the face) can cause thinning and lightening of the skin.  This can lead to the formation of stretch marks in the folds.   b. Calcipotriene and calcipotriol (Vitamin D derivatives):  These are usually used in conjunction with a steroid cream.  Sometimes they may be used as maintenance once psoriasis is under control. c. Retinoids:  sometimes used in conjunction with a topical steroid cream.  Women should not use retinoids if they are pregnant. d. Coal tar:  an older treatment that   is still effective, especially in combination with steroids.  It can be messy and may have an odor in some preparations. 2. Light treatments:  may provide a safe and effective treatment for psoriasis.  The main risks of light therapy are sunburn and possible increase in skin cancers.   a. Laser therapy:  can treat a certain stubborn area of psoriasis such as scalp, feet, and hands.  It is not used for large areas. b. Narrowband UVB:  A patient stands in  front of panels of lights for a set amount of time.  A typical course might be 24 treatments over 2 months.  c. PUVA:  This treatment combines exposure to UVA light with light-sensitizing medication called psoralen.  It may come as a pill or as a lotion.  The main side effects are nausea (from the psoralen pill) and significantly increased risk of skin cancer. 3. Oral medications:  used for moderate to severe psoriasis.  These medications are very effective, but they have a number of potentially serious side effects. a. Methotrexate b. Soriatane (acitretin) c. Cyclosporine 4. Biologic medications:  used for moderate to severe psoriasis.  These are newer medications that suppress the immune cells responsible for psoriasis.  They generally produce good results, but they all increase the risk of infection.  These are given as injections or IV infusions. a. Enbrel (etanercept) b. Humira (adalimumab) c. Remicade (infliximab) d. Stelara (ustekinumab)  Recent studies have found that patients with psoriasis are more prone to developing metabolic syndrome:  high blood pressure, coronary artery disease, high cholesterol, and diabetes.  It is very important for patients with psoriasis to live healthy lifestyles.  Diet and exercise are important.  Support groups:   www.psoriasis.org , http://www.[facility]are[facility].com/psoriasisnet/index.html   Topical steroids (such as triamcinolone, fluocinolone, fluocinonide, mometasone, clobetasol, halobetasol, betamethasone, hydrocortisone) can cause thinning and lightening of the skin if they are used for too long in the same area. Your physician has selected the right strength medicine for your problem and area affected on the body. Please use your medication only as directed by your physician to prevent side effects.

## 2020-02-06 ENCOUNTER — Ambulatory Visit (INDEPENDENT_AMBULATORY_CARE_PROVIDER_SITE_OTHER): Payer: Self-pay | Admitting: Dermatology

## 2020-02-06 ENCOUNTER — Other Ambulatory Visit: Payer: Self-pay

## 2020-02-06 DIAGNOSIS — L409 Psoriasis, unspecified: Secondary | ICD-10-CM

## 2020-02-06 MED ORDER — CALCIPOTRIENE 0.005 % EX CREA: TOPICAL_CREAM | Freq: Every day | CUTANEOUS | 3 refills | Status: DC

## 2020-02-06 MED ORDER — TRIAMCINOLONE ACETONIDE 0.1 % EX OINT: 1.0000 "application " | TOPICAL_OINTMENT | CUTANEOUS | 2 refills | Status: AC

## 2020-02-06 NOTE — Progress Notes (Signed)
° °  Follow-Up Visit   Subjective  Emily Parker is a 44 y.o. female who presents for the following: Psoriasis (hands and feet, 6wk f/u, itchy, painful,  improved a little, TMC 0.1% cr bid 5d/wk).  The following portions of the chart were reviewed this encounter and updated as appropriate:  Tobacco   Allergies   Meds   Problems   Med Hx   Surg Hx   Fam Hx      Review of Systems:  No other skin or systemic complaints except as noted in HPI or Assessment and Plan.  Objective  Well appearing patient in no apparent distress; mood and affect are within normal limits.  A focused examination was performed including hands, feet. Relevant physical exam findings are noted in the Assessment and Plan.  Objective  bil hands and feet, elbows: Well-demarcated erythematous papules/plaques with silvery scale, guttate pink scaly papules. Thickening of the skin on the elbows,    Assessment & Plan  Psoriasis bil hands and feet, elbows Severe affecting activities of daily living involving the hands and feet Chronic, not currently at goal.   Psoriasis is a chronic non-curable, but treatable genetic/hereditary disease that may have other systemic features affecting other organ systems such as joints (Psoriatic Arthritis). It is associated with an increased risk of inflammatory bowel disease, heart disease, non-alcoholic fatty liver disease, and depression.    Labs from 12/12/19 reviewed Discussed Xtrac laser, info given  Decrease to TMC 0.1% oint qd 5d/wk Start Calcipotriene cr qd 7d/wk  Start Otezla 30mg  1 po qd, pt will titrate up to 30mg  1 po qd sample given Lot 03/2021 [id number] script/application faxed to 04/2021  Side effects of Otezla (apremilast) include diarrhea, nausea, headache, upper respiratory infection, depression, and weight decrease (5-10%). It should only be taken by pregnant women after a discussion regarding risks and benefits with their doctor. Goal is  control of skin condition, not cure.  The use of [place] requires long term medication management, including periodic office visits.  Topical steroids (such as triamcinolone, fluocinolone, fluocinonide, mometasone, clobetasol, halobetasol, betamethasone, hydrocortisone) can cause thinning and lightening of the skin if they are used for too long in the same area. Your physician has selected the right strength medicine for your problem and area affected on the body. Please use your medication only as directed by your physician to prevent side effects.    calcipotriene (DOVONOX) 0.005 % cream - bil hands and feet, elbows  triamcinolone ointment (KENALOG) 0.1 % - bil hands and feet, elbows  Return in about 1 month (around 03/07/2020) for Psoriasis f/u.   I, [place], RMA, am acting as scribe for 14/01/2020, MD .  Documentation: I have reviewed the above documentation for accuracy and completeness, and I agree with the above.  Ardis Rowan, MD

## 2020-02-11 ENCOUNTER — Encounter: Payer: Self-pay | Admitting: Dermatology

## 2020-02-19 ENCOUNTER — Telehealth: Payer: Self-pay | Admitting: Pharmacist

## 2020-02-19 NOTE — Telephone Encounter (Signed)
Patient failed to provide requested 2021 financial documentation. No additional medication assistance will be provided by [facility] without the required proof of income documentation. Patient notified by letter Debra Cheek Administrative Assistant [facility] 

## 2020-03-05 ENCOUNTER — Other Ambulatory Visit: Payer: Self-pay

## 2020-03-05 ENCOUNTER — Ambulatory Visit (INDEPENDENT_AMBULATORY_CARE_PROVIDER_SITE_OTHER): Payer: Self-pay | Admitting: Dermatology

## 2020-03-05 DIAGNOSIS — L409 Psoriasis, unspecified: Secondary | ICD-10-CM

## 2020-03-05 NOTE — Progress Notes (Signed)
   Follow-Up Visit   Subjective  Emily Parker is a 44 y.o. female who presents for the following: Psoriasis (1 month f/u psoriasis pt taking Otezla tablets, pt using Triamcinolone ointment with a fair response, Calcipotriene was too expensive ).  The following portions of the chart were reviewed this encounter and updated as appropriate:   Tobacco  Allergies  Meds  Problems  Med Hx  Surg Hx  Fam Hx     Review of Systems:  No other skin or systemic complaints except as noted in HPI or Assessment and Plan.  Objective  Well appearing patient in no apparent distress; mood and affect are within normal limits.  A focused examination was performed including face,hands,feet . Relevant physical exam findings are noted in the Assessment and Plan.  Objective  bil lateral hands, feet: Well-demarcated erythematous papules/plaques with silvery scale, guttate pink scaly papules. Thickening of the skin on the elbows,      Assessment & Plan  Psoriasis bil lateral hands, feet Chronic condition, no cure, only control. Not currently at goal. Severe affecting activities of daily living involving the hands and feet  Titrate Otezla up to twice daily.   Continue triamcinolone   psoriasis is a chronic non-curable, but treatable genetic/hereditary disease that may have other systemic features affecting other organ systems such as joints (Psoriatic Arthritis). It is associated with an increased risk of inflammatory bowel disease, heart disease, non-alcoholic fatty liver disease, and depression.     Occasional headache, occasional diarrhea but better now   Side effects of Otezla (apremilast) include diarrhea, nausea, headache, upper respiratory infection, depression, and weight decrease (5-10%). It should only be taken by pregnant women after a discussion regarding risks and benefits with their doctor. Goal is control of skin condition, not cure.  The use of [place] requires long term medication  management, including periodic office visits.   Topical steroids (such as triamcinolone, fluocinolone, fluocinonide, mometasone, clobetasol, halobetasol, betamethasone, hydrocortisone) can cause thinning and lightening of the skin if they are used for too long in the same area. Your physician has selected the right strength medicine for your problem and area affected on the body. Please use your medication only as directed by your physician to prevent side effects.    Cont Otezla 30 mg increase slowly up to 60 mg  Cont Triamcinlone  0.1% ointment apply to skin daily   Other Related Medications triamcinolone ointment (KENALOG) 0.1 %  Return in about 2 months (around 05/06/2020).  IAngelique Holm, CMA, am acting as scribe for Armida Sans, MD .  Documentation: I have reviewed the above documentation for accuracy and completeness, and I agree with the above.  Armida Sans, MD

## 2020-03-11 ENCOUNTER — Encounter: Payer: Self-pay | Admitting: Dermatology

## 2020-03-12 ENCOUNTER — Other Ambulatory Visit: Payer: Self-pay

## 2020-03-19 ENCOUNTER — Ambulatory Visit: Payer: Self-pay

## 2020-03-31 ENCOUNTER — Ambulatory Visit: Payer: Self-pay

## 2020-04-02 ENCOUNTER — Ambulatory Visit: Payer: Self-pay

## 2020-04-30 ENCOUNTER — Ambulatory Visit: Payer: Self-pay

## 2020-05-14 ENCOUNTER — Ambulatory Visit: Payer: Self-pay | Admitting: Dermatology

## 2021-02-24 ENCOUNTER — Other Ambulatory Visit: Payer: Self-pay

## 2021-02-24 ENCOUNTER — Encounter: Payer: Self-pay | Admitting: Family Medicine

## 2021-02-24 ENCOUNTER — Ambulatory Visit: Payer: Self-pay | Admitting: Family Medicine

## 2021-02-24 DIAGNOSIS — Z113 Encounter for screening for infections with a predominantly sexual mode of transmission: Secondary | ICD-10-CM

## 2021-02-24 LAB — WET PREP FOR TRICH, YEAST, CLUE
Trichomonas Exam: NEGATIVE
Yeast Exam: NEGATIVE

## 2021-02-24 NOTE — Progress Notes (Signed)
Pt here for STD check.  Wet mount results reviewed, no treatment required.  Pt declined condoms. Yaw Escoto M Giovana Faciane, RN  

## 2021-02-28 NOTE — Progress Notes (Signed)
[facility] Department  STI clinic/screening visit [address] [place] [address] [phone]  Subjective:  MADELYNNE LASKER is a 45 y.o. female being seen today for an STI screening visit. The patient reports they do not have symptoms.  Patient reports that they do not desire a pregnancy in the next year.   They reported they are not interested in discussing contraception today.    Patient's last menstrual period was 01/22/2021 (approximate).   Patient has the following medical conditions:  There are no problems to display for this patient.   Chief Complaint  Patient presents with   SEXUALLY TRANSMITTED DISEASE    Screening    HPI  Patient reports here for screening, denies s/sx   Last HIV test per patient/review of record was 12/12/2019 Patient reports last pap was "years ago".   Screening for MPX risk: Does the patient have an unexplained rash? No Is the patient MSM? No Does the patient endorse multiple sex partners or anonymous sex partners? No Did the patient have close or sexual contact with a person diagnosed with MPX? No Has the patient traveled outside the [place] where MPX is endemic? No Is there a high clinical suspicion for MPX-- evidenced by one of the following No  -Unlikely to be chickenpox  -Lymphadenopathy  -Rash that present in same phase of evolution on any given body part See flowsheet for further details and programmatic requirements.    The following portions of the patient's history were reviewed and updated as appropriate: allergies, current medications, past medical history, past social history, past surgical history and problem list.  Objective:  There were no vitals filed for this visit.  Physical Exam Vitals and nursing note reviewed.  Constitutional:      Appearance: Normal appearance.  HENT:     Head: Normocephalic and atraumatic.     Mouth/Throat:     Mouth: Mucous membranes are moist.     Pharynx: Oropharynx is  clear. No oropharyngeal exudate or posterior oropharyngeal erythema.  Pulmonary:     Effort: Pulmonary effort is normal.  Abdominal:     General: Abdomen is flat.     Palpations: There is no mass.     Tenderness: There is no abdominal tenderness. There is no rebound.  Genitourinary:    General: Normal vulva.     Exam position: Lithotomy position.     Pubic Area: No rash or pubic lice.      Labia:        Right: No rash or lesion.        Left: No rash or lesion.      Vagina: Normal. No vaginal discharge, erythema, bleeding or lesions.     Cervix: No cervical motion tenderness, discharge, friability, lesion or erythema.     Uterus: Normal.      Adnexa: Right adnexa normal and left adnexa normal.     Rectum: Normal.     Comments: External genitalia without, lice, nits, erythema, edema , lesions or inguinal adenopathy. Vagina with normal mucosa and discharge and pH equals 4.  Cervix without visual lesions, uterus firm, mobile, non-tender, no masses, CMT adnexal fullness or tenderness.   Musculoskeletal:     Cervical back: Normal range of motion and neck supple.  Lymphadenopathy:     Head:     Right side of head: No preauricular or posterior auricular adenopathy.     Left side of head: No preauricular or posterior auricular adenopathy.     Cervical:  No cervical adenopathy.     Upper Body:     Right upper body: No supraclavicular or axillary adenopathy.     Left upper body: No supraclavicular or axillary adenopathy.     Lower Body: No right inguinal adenopathy. No left inguinal adenopathy.  Skin:    General: Skin is warm and dry.     Findings: No rash.  Neurological:     Mental Status: She is alert and oriented to person, place, and time.  Psychiatric:        Mood and Affect: Mood normal.        Behavior: Behavior normal.     Assessment and Plan:  Shaniece Bussa Cinco is a 45 y.o. female presenting to the [facility] Department for STI screening  1. Screening examination  for venereal disease Patient accepted all screenings including wet prep, vaginal CT/GC and bloodwork for HIV/RPR.  Patient meets criteria for HepB screening? Yes. Ordered? No - declined  Patient meets criteria for HepC screening? No. Ordered? No - declined   Wet prep results neg    No Treatment needed  Discussed time line for [facility] results and that patient will be called with positive results and encouraged patient to call if she had not heard in 2 weeks.  Counseled to return or seek care for continued or worsening symptoms Recommended condom use with all sex  Patient is currently using  BTL  to prevent pregnancy.   - Chlamydia/Gonorrhea [facility] - WET PREP FOR TRICH, YEAST, CLUE - Syphilis Serology, [facility] - HIV [facility]   Return for as needed.  No future appointments.  Wendi Snipes, FNP

## 2024-03-04 ENCOUNTER — Other Ambulatory Visit (HOSPITAL_COMMUNITY): Payer: Self-pay | Admitting: Internal Medicine

## 2024-03-04 DIAGNOSIS — Z1231 Encounter for screening mammogram for malignant neoplasm of breast: Secondary | ICD-10-CM

## 2024-03-06 ENCOUNTER — Inpatient Hospital Stay (HOSPITAL_COMMUNITY)
Admission: RE | Admit: 2024-03-06 | Discharge: 2024-03-06 | Payer: Self-pay | Attending: Internal Medicine | Admitting: Internal Medicine

## 2024-03-06 ENCOUNTER — Encounter (HOSPITAL_COMMUNITY): Payer: Self-pay

## 2024-03-06 ENCOUNTER — Other Ambulatory Visit (HOSPITAL_COMMUNITY)
Admission: RE | Admit: 2024-03-06 | Discharge: 2024-03-06 | Disposition: A | Discharge status: Home / Self Care | Source: Ambulatory Visit | Attending: Internal Medicine | Admitting: Internal Medicine

## 2024-03-06 DIAGNOSIS — Z1231 Encounter for screening mammogram for malignant neoplasm of breast: Secondary | ICD-10-CM | POA: Insufficient documentation

## 2024-03-06 DIAGNOSIS — Z0001 Encounter for general adult medical examination with abnormal findings: Secondary | ICD-10-CM | POA: Diagnosis present

## 2024-03-06 LAB — CBC WITH DIFFERENTIAL/PLATELET
Abs Immature Granulocytes: 0.03 K/uL (ref 0.00–0.07)
Basophils Absolute: 0 K/uL (ref 0.0–0.1)
Basophils Relative: 0 %
Eosinophils Absolute: 0.1 K/uL (ref 0.0–0.5)
Eosinophils Relative: 2 %
HCT: 34.6 % — ABNORMAL LOW (ref 36.0–46.0)
Hemoglobin: 10.4 g/dL — ABNORMAL LOW (ref 12.0–15.0)
Immature Granulocytes: 0 %
Lymphocytes Relative: 27 %
Lymphs Abs: 2.2 K/uL (ref 0.7–4.0)
MCH: 21.9 pg — ABNORMAL LOW (ref 26.0–34.0)
MCHC: 30.1 g/dL (ref 30.0–36.0)
MCV: 73 fL — ABNORMAL LOW (ref 80.0–100.0)
Monocytes Absolute: 1 K/uL (ref 0.1–1.0)
Monocytes Relative: 12 %
Neutro Abs: 4.8 K/uL (ref 1.7–7.7)
Neutrophils Relative %: 59 %
Platelets: 292 K/uL (ref 150–400)
RBC: 4.74 MIL/uL (ref 3.87–5.11)
RDW: 18.8 % — ABNORMAL HIGH (ref 11.5–15.5)
WBC: 8.2 K/uL (ref 4.0–10.5)
nRBC: 0 % (ref 0.0–0.2)

## 2024-03-06 LAB — LIPID PANEL
Cholesterol: 133 mg/dL (ref 0–200)
HDL: 35 mg/dL — ABNORMAL LOW (ref >40)
LDL Cholesterol: 73 mg/dL (ref 0–99)
Total CHOL/HDL Ratio: 3.8 ratio
Triglycerides: 127 mg/dL (ref <150)
VLDL: 25 mg/dL (ref 0–40)

## 2024-03-06 LAB — HEPATIC FUNCTION PANEL
ALT: 10 U/L (ref 0–44)
AST: 17 U/L (ref 15–41)
Albumin: 4.3 g/dL (ref 3.5–5.0)
Alkaline Phosphatase: 96 U/L (ref 38–126)
Bilirubin, Direct: <0.1 mg/dL (ref 0.0–0.2)
Total Bilirubin: 0.3 mg/dL (ref 0.0–1.2)
Total Protein: 7.9 g/dL (ref 6.5–8.1)

## 2024-03-06 LAB — BASIC METABOLIC PANEL WITH GFR
Anion gap: 13 (ref 5–15)
BUN: 15 mg/dL (ref 6–20)
CO2: 19 mmol/L — ABNORMAL LOW (ref 22–32)
Calcium: 9.2 mg/dL (ref 8.9–10.3)
Chloride: 107 mmol/L (ref 98–111)
Creatinine, Ser: 0.81 mg/dL (ref 0.44–1.00)
GFR, Estimated: >60 mL/min (ref >60)
Glucose, Bld: 102 mg/dL — ABNORMAL HIGH (ref 70–99)
Potassium: 4.2 mmol/L (ref 3.5–5.1)
Sodium: 139 mmol/L (ref 135–145)

## 2024-04-17 ENCOUNTER — Encounter (INDEPENDENT_AMBULATORY_CARE_PROVIDER_SITE_OTHER): Payer: Self-pay | Admitting: *Deleted

## 2024-04-17 ENCOUNTER — Other Ambulatory Visit (HOSPITAL_COMMUNITY)
Admission: RE | Admit: 2024-04-17 | Discharge: 2024-04-17 | Disposition: A | Source: Ambulatory Visit | Attending: Women's Health | Admitting: Women's Health

## 2024-04-17 ENCOUNTER — Ambulatory Visit: Admitting: Women's Health

## 2024-04-17 ENCOUNTER — Encounter: Payer: Self-pay | Admitting: Women's Health

## 2024-04-17 VITALS — BP 143/88 | HR 98 | Ht 65.0 in | Wt 284.0 lb

## 2024-04-17 DIAGNOSIS — Z113 Encounter for screening for infections with a predominantly sexual mode of transmission: Secondary | ICD-10-CM | POA: Insufficient documentation

## 2024-04-17 DIAGNOSIS — Z1151 Encounter for screening for human papillomavirus (HPV): Secondary | ICD-10-CM

## 2024-04-17 DIAGNOSIS — R030 Elevated blood-pressure reading, without diagnosis of hypertension: Secondary | ICD-10-CM | POA: Insufficient documentation

## 2024-04-17 DIAGNOSIS — Z9189 Other specified personal risk factors, not elsewhere classified: Secondary | ICD-10-CM

## 2024-04-17 DIAGNOSIS — Z01419 Encounter for gynecological examination (general) (routine) without abnormal findings: Secondary | ICD-10-CM | POA: Diagnosis not present

## 2024-04-17 DIAGNOSIS — Z124 Encounter for screening for malignant neoplasm of cervix: Secondary | ICD-10-CM | POA: Insufficient documentation

## 2024-04-17 DIAGNOSIS — Z803 Family history of malignant neoplasm of breast: Secondary | ICD-10-CM | POA: Diagnosis not present

## 2024-04-17 DIAGNOSIS — Z1211 Encounter for screening for malignant neoplasm of colon: Secondary | ICD-10-CM

## 2024-04-17 NOTE — Progress Notes (Signed)
 "  WELL-WOMAN EXAMINATION Patient name: Emily Parker MRN [id number]  Date of birth: [birth year] Chief Complaint:   Annual Exam  History of Present Illness:   Emily Parker is a 49 y.o. [id number] African-American female being seen today for a routine well-woman exam.  Current complaints: partner just tested seropositive for HSV, neither one has ever had any outbreaks. He is going back to retest soon.  No h/o HTN, was up at last PCP visit, goes back in March  PCP: Carlette      Wants STD screen (does not want HSV, discussed it can be inaccurate unless we culture a suspected lesion) Patient's last menstrual period was 03/25/2024 (approximate). The current method of family planning is tubal ligation.  Last pap >58yrs ago. Results were: negative per pt report at office in [place]. H/O abnormal pap: no Last mammogram: 03/06/24. Results were: normal. Family h/o breast cancer: yes mom died @ 47, neg genetic testing per pt Last colonoscopy: 33yrs ago. Results were: hemorrhoids. Family h/o colorectal cancer: a GGM     04/17/2024    9:11 AM  Depression screen PHQ 2/9  Decreased Interest 0  Down, Depressed, Hopeless 1  PHQ - 2 Score 1  Altered sleeping 1  Tired, decreased energy 1  Change in appetite 1  Feeling bad or failure about yourself  0  Trouble concentrating 0  Moving slowly or fidgety/restless 0  Suicidal thoughts 0  PHQ-9 Score 4        04/17/2024    9:11 AM  GAD 7 : Generalized Anxiety Score  Nervous, Anxious, on Edge 0  Control/stop worrying 0  Worry too much - different things 0  Trouble relaxing 0  Restless 0  Easily annoyed or irritable 1  Afraid - awful might happen 0  Total GAD 7 Score 1     Review of Systems:   Pertinent items are noted in HPI Denies any headaches, blurred vision, fatigue, shortness of breath, chest pain, abdominal pain, abnormal vaginal discharge/itching/odor/irritation, problems with periods, bowel movements, urination, or intercourse unless  otherwise stated above. Pertinent History Reviewed:  Reviewed past medical,surgical, social and family history.  Reviewed problem list, medications and allergies. Physical Assessment:   Vitals:   04/17/24 0834 04/17/24 0849  BP: (!) 164/104 (!) 143/88  Pulse: 98   Weight: 284 lb (128.8 kg)   Height: 5' 5 (1.651 m)   Body mass index is 47.26 kg/m.        Physical Examination:   General appearance - well appearing, and in no distress  Mental status - alert, oriented to person, place, and time  Psych:  She has a normal mood and affect  Skin - warm and dry, normal color, no suspicious lesions noted  Chest - effort normal, all lung fields clear to auscultation bilaterally  Heart - normal rate and regular rhythm  Neck:  midline trachea, no thyromegaly or nodules  Breasts - breasts appear normal, no suspicious masses, no skin or nipple changes or  axillary nodes  Abdomen - soft, nontender, nondistended, no masses or organomegaly  Pelvic - VULVA: normal appearing vulva with no masses, tenderness or lesions  VAGINA: normal appearing vagina with normal color and discharge, no lesions  CERVIX: normal appearing cervix without discharge or lesions, no CMT  Thin prep pap is done w/ HR HPV cotesting  UTERUS: uterus is felt to be normal size, shape, consistency and nontender   ADNEXA: No adnexal masses or tenderness noted.  Extremities:  No swelling  or varicosities noted  Chaperone: Aleck Blase  No results found for this or any previous visit (from the past 24 hours).  Assessment & Plan:  1) Well-Woman Exam  2) STD screen> gc/ct/trich on pap, HIV/RPR blood, doesn't want HSV   3) Elevated bp w/ no h/o HTN> to let Dr. Carlette know today  4) Family hx breast cancer> mom passed away @ 48yo, neg genetic testing per pt, mammo in Dec neg, continue yearly  Labs/procedures today: pap, labs  Mammogram: Dec, or sooner if problems Colonoscopy: schedule screening colonoscopy as soon as possible,  order placed for referral  Orders Placed This Encounter  Procedures   HIV Antibody (routine testing w rflx)   RPR W/RFLX TO RPR TITER, TREPONEMAL AB, SCREEN AND DIAGNOSIS   Ambulatory referral to Gastroenterology    Meds: No orders of the defined types were placed in this encounter.   Follow-up: Return in about 1 year (around 04/17/2025) for Physical.  Suzen JONELLE Fetters CNM, [facility] 04/17/2024 9:18 AM  "

## 2024-04-17 NOTE — Patient Instructions (Signed)
 Let Dr. Carlette know about your blood pressures 164/104,  143/88

## 2024-04-18 LAB — HIV ANTIBODY (ROUTINE TESTING W REFLEX): HIV Screen 4th Generation wRfx: NONREACTIVE

## 2024-04-18 LAB — SYPHILIS: RPR W/REFLEX TO RPR TITER AND TREPONEMAL ANTIBODIES, TRADITIONAL SCREENING AND DIAGNOSIS ALGORITHM: RPR Ser Ql: NONREACTIVE

## 2024-04-19 LAB — CYTOLOGY - PAP
Chlamydia: NEGATIVE
Comment: NEGATIVE
Comment: NEGATIVE
Comment: NEGATIVE
Comment: NORMAL
Diagnosis: NEGATIVE
High risk HPV: NEGATIVE
Neisseria Gonorrhea: NEGATIVE
Trichomonas: POSITIVE — AB

## 2024-04-23 ENCOUNTER — Other Ambulatory Visit: Payer: Self-pay

## 2024-04-23 ENCOUNTER — Other Ambulatory Visit: Payer: Self-pay | Admitting: *Deleted

## 2024-04-23 ENCOUNTER — Ambulatory Visit: Payer: Self-pay | Admitting: Women's Health

## 2024-04-23 DIAGNOSIS — A599 Trichomoniasis, unspecified: Secondary | ICD-10-CM | POA: Insufficient documentation

## 2024-04-23 MED ORDER — METRONIDAZOLE 500 MG PO TABS
500.0000 mg | ORAL_TABLET | Freq: Two times a day (BID) | ORAL | 0 refills | Status: DC
  Filled 2024-04-23: qty 14, 7d supply, fill #0

## 2024-04-23 MED ORDER — METRONIDAZOLE 500 MG PO TABS: 500.0000 mg | ORAL_TABLET | Freq: Two times a day (BID) | ORAL | 0 refills | Status: AC

## 2024-05-22 ENCOUNTER — Other Ambulatory Visit
